# Patient Record
Sex: Female | Born: 1935 | Race: White | Hispanic: No | Smoking: Never smoker
Health system: Southern US, Community
[De-identification: ages and names within clinical notes are randomized; demographics above are authoritative.]

## PROBLEM LIST (undated history)

## (undated) DIAGNOSIS — K5792 Diverticulitis of intestine, part unspecified, without perforation or abscess without bleeding: Secondary | ICD-10-CM

## (undated) DIAGNOSIS — C801 Malignant (primary) neoplasm, unspecified: Secondary | ICD-10-CM

## (undated) DIAGNOSIS — M8580 Other specified disorders of bone density and structure, unspecified site: Secondary | ICD-10-CM

## (undated) DIAGNOSIS — Z86718 Personal history of other venous thrombosis and embolism: Secondary | ICD-10-CM

## (undated) DIAGNOSIS — I493 Ventricular premature depolarization: Secondary | ICD-10-CM

## (undated) DIAGNOSIS — N952 Postmenopausal atrophic vaginitis: Secondary | ICD-10-CM

## (undated) DIAGNOSIS — N3281 Overactive bladder: Secondary | ICD-10-CM

## (undated) HISTORY — DX: Postmenopausal atrophic vaginitis: N95.2

## (undated) HISTORY — PX: THYROIDECTOMY, PARTIAL: SHX18

## (undated) HISTORY — DX: Malignant (primary) neoplasm, unspecified: C80.1

## (undated) HISTORY — DX: Overactive bladder: N32.81

## (undated) HISTORY — DX: Ventricular premature depolarization: I49.3

## (undated) HISTORY — PX: OTHER SURGICAL HISTORY: SHX169

## (undated) HISTORY — DX: Personal history of other venous thrombosis and embolism: Z86.718

## (undated) HISTORY — PX: DILATION AND CURETTAGE OF UTERUS: SHX78

## (undated) HISTORY — DX: Other specified disorders of bone density and structure, unspecified site: M85.80

---

## 1998-06-08 HISTORY — PX: CHOLECYSTECTOMY: SHX55

## 1998-07-15 ENCOUNTER — Other Ambulatory Visit: Admission: RE | Admit: 1998-07-15 | Discharge: 1998-07-15 | Payer: Self-pay | Admitting: Obstetrics and Gynecology

## 1999-09-12 ENCOUNTER — Other Ambulatory Visit: Admission: RE | Admit: 1999-09-12 | Discharge: 1999-09-12 | Payer: Self-pay | Admitting: Obstetrics and Gynecology

## 1999-09-24 ENCOUNTER — Other Ambulatory Visit: Admission: RE | Admit: 1999-09-24 | Discharge: 1999-09-24 | Payer: Self-pay | Admitting: Obstetrics and Gynecology

## 1999-09-24 ENCOUNTER — Encounter (INDEPENDENT_AMBULATORY_CARE_PROVIDER_SITE_OTHER): Payer: Self-pay

## 2000-10-15 ENCOUNTER — Other Ambulatory Visit: Admission: RE | Admit: 2000-10-15 | Discharge: 2000-10-15 | Payer: Self-pay | Admitting: Obstetrics and Gynecology

## 2001-10-25 ENCOUNTER — Other Ambulatory Visit: Admission: RE | Admit: 2001-10-25 | Discharge: 2001-10-25 | Payer: Self-pay | Admitting: Obstetrics and Gynecology

## 2002-10-27 ENCOUNTER — Other Ambulatory Visit: Admission: RE | Admit: 2002-10-27 | Discharge: 2002-10-27 | Payer: Self-pay | Admitting: Obstetrics and Gynecology

## 2003-11-15 ENCOUNTER — Other Ambulatory Visit: Admission: RE | Admit: 2003-11-15 | Discharge: 2003-11-15 | Payer: Self-pay | Admitting: Obstetrics and Gynecology

## 2004-12-25 ENCOUNTER — Other Ambulatory Visit: Admission: RE | Admit: 2004-12-25 | Discharge: 2004-12-25 | Payer: Self-pay | Admitting: Obstetrics and Gynecology

## 2005-06-08 DIAGNOSIS — C801 Malignant (primary) neoplasm, unspecified: Secondary | ICD-10-CM

## 2005-06-08 HISTORY — DX: Malignant (primary) neoplasm, unspecified: C80.1

## 2005-06-08 HISTORY — PX: BREAST LUMPECTOMY: SHX2

## 2006-01-05 ENCOUNTER — Other Ambulatory Visit: Admission: RE | Admit: 2006-01-05 | Discharge: 2006-01-05 | Payer: Self-pay | Admitting: Obstetrics and Gynecology

## 2007-01-12 ENCOUNTER — Other Ambulatory Visit: Admission: RE | Admit: 2007-01-12 | Discharge: 2007-01-12 | Payer: Self-pay | Admitting: Obstetrics and Gynecology

## 2007-11-27 ENCOUNTER — Emergency Department (HOSPITAL_COMMUNITY): Admission: EM | Admit: 2007-11-27 | Discharge: 2007-11-27 | Payer: Self-pay | Admitting: Emergency Medicine

## 2008-02-07 ENCOUNTER — Ambulatory Visit: Payer: Self-pay | Admitting: Obstetrics and Gynecology

## 2008-02-07 ENCOUNTER — Other Ambulatory Visit: Admission: RE | Admit: 2008-02-07 | Discharge: 2008-02-07 | Payer: Self-pay | Admitting: Obstetrics and Gynecology

## 2008-02-07 ENCOUNTER — Encounter: Payer: Self-pay | Admitting: Obstetrics and Gynecology

## 2008-03-13 ENCOUNTER — Ambulatory Visit: Payer: Self-pay | Admitting: Obstetrics and Gynecology

## 2008-05-07 ENCOUNTER — Emergency Department: Payer: Self-pay | Admitting: Emergency Medicine

## 2008-07-23 ENCOUNTER — Ambulatory Visit: Payer: Self-pay | Admitting: Obstetrics and Gynecology

## 2008-07-24 ENCOUNTER — Ambulatory Visit: Payer: Self-pay | Admitting: Obstetrics and Gynecology

## 2008-07-26 ENCOUNTER — Ambulatory Visit: Payer: Self-pay | Admitting: Women's Health

## 2008-08-15 ENCOUNTER — Ambulatory Visit: Payer: Self-pay | Admitting: Women's Health

## 2009-03-22 ENCOUNTER — Other Ambulatory Visit: Admission: RE | Admit: 2009-03-22 | Discharge: 2009-03-22 | Payer: Self-pay | Admitting: Obstetrics and Gynecology

## 2009-03-22 ENCOUNTER — Ambulatory Visit: Payer: Self-pay | Admitting: Obstetrics and Gynecology

## 2009-03-22 ENCOUNTER — Encounter: Payer: Self-pay | Admitting: Obstetrics and Gynecology

## 2009-07-01 ENCOUNTER — Emergency Department: Payer: Self-pay | Admitting: Emergency Medicine

## 2010-04-24 ENCOUNTER — Other Ambulatory Visit: Admission: RE | Admit: 2010-04-24 | Discharge: 2010-04-24 | Payer: Self-pay | Admitting: Obstetrics and Gynecology

## 2010-04-24 ENCOUNTER — Ambulatory Visit: Payer: Self-pay | Admitting: Obstetrics and Gynecology

## 2011-06-09 DIAGNOSIS — Z86718 Personal history of other venous thrombosis and embolism: Secondary | ICD-10-CM

## 2011-06-09 HISTORY — DX: Personal history of other venous thrombosis and embolism: Z86.718

## 2011-08-24 ENCOUNTER — Ambulatory Visit (INDEPENDENT_AMBULATORY_CARE_PROVIDER_SITE_OTHER): Payer: Medicare Other | Admitting: Women's Health

## 2011-08-24 ENCOUNTER — Other Ambulatory Visit: Payer: Self-pay | Admitting: Women's Health

## 2011-08-24 DIAGNOSIS — N899 Noninflammatory disorder of vagina, unspecified: Secondary | ICD-10-CM

## 2011-08-24 DIAGNOSIS — N390 Urinary tract infection, site not specified: Secondary | ICD-10-CM

## 2011-08-24 DIAGNOSIS — N3281 Overactive bladder: Secondary | ICD-10-CM | POA: Insufficient documentation

## 2011-08-24 DIAGNOSIS — N952 Postmenopausal atrophic vaginitis: Secondary | ICD-10-CM | POA: Insufficient documentation

## 2011-08-24 DIAGNOSIS — C801 Malignant (primary) neoplasm, unspecified: Secondary | ICD-10-CM | POA: Insufficient documentation

## 2011-08-24 DIAGNOSIS — M8580 Other specified disorders of bone density and structure, unspecified site: Secondary | ICD-10-CM | POA: Insufficient documentation

## 2011-08-24 DIAGNOSIS — R300 Dysuria: Secondary | ICD-10-CM

## 2011-08-24 DIAGNOSIS — N898 Other specified noninflammatory disorders of vagina: Secondary | ICD-10-CM

## 2011-08-24 LAB — URINALYSIS W MICROSCOPIC + REFLEX CULTURE
Casts: NONE SEEN
Crystals: NONE SEEN
Nitrite: NEGATIVE
Specific Gravity, Urine: 1.01 (ref 1.005–1.030)
pH: 6 (ref 5.0–8.0)

## 2011-08-24 LAB — WET PREP FOR TRICH, YEAST, CLUE

## 2011-08-24 MED ORDER — CIPROFLOXACIN HCL 250 MG PO TABS
250.0000 mg | ORAL_TABLET | Freq: Two times a day (BID) | ORAL | Status: AC
Start: 2011-08-24 — End: 2011-09-03

## 2011-08-24 NOTE — Patient Instructions (Signed)

## 2011-08-24 NOTE — Progress Notes (Signed)
Patient ID: Rebecca Velez, female   DOB: 1934-01-24, 78 y.o.   MRN: [id number] Presents with complaint of urinary urgency, frequency, pain at the end of  stream for several days. Denies a fever, slight vaginal irritation, denies discharge. Not sexually active.  UA: Large leukocytes, TNTC wbc's, many bacteria, moderate blood. Exam: No CVAT, external genitalia within normal limits, speculum exam scant discharge wet prep negative.  UTI  Plan: Cipro 250 mg twice a day for 5 days #10 no refills. Urine culture pending. UTI prevention discussed. Instructed to call if no relief of symptoms. Urine culture pending.

## 2011-08-27 LAB — URINE CULTURE: Colony Count: >=100000

## 2012-03-30 ENCOUNTER — Encounter: Payer: Self-pay | Admitting: Obstetrics and Gynecology

## 2012-03-30 ENCOUNTER — Ambulatory Visit (INDEPENDENT_AMBULATORY_CARE_PROVIDER_SITE_OTHER): Payer: Medicare Other | Admitting: Obstetrics and Gynecology

## 2012-03-30 ENCOUNTER — Other Ambulatory Visit (HOSPITAL_COMMUNITY)
Admission: RE | Admit: 2012-03-30 | Discharge: 2012-03-30 | Disposition: A | Discharge status: Home / Self Care | Payer: Medicare Other | Source: Ambulatory Visit | Attending: Obstetrics and Gynecology | Admitting: Obstetrics and Gynecology

## 2012-03-30 VITALS — BP 120/74 | Ht 67.5 in | Wt 154.0 lb

## 2012-03-30 DIAGNOSIS — M949 Disorder of cartilage, unspecified: Secondary | ICD-10-CM

## 2012-03-30 DIAGNOSIS — N8500 Endometrial hyperplasia, unspecified: Secondary | ICD-10-CM

## 2012-03-30 DIAGNOSIS — N952 Postmenopausal atrophic vaginitis: Secondary | ICD-10-CM

## 2012-03-30 DIAGNOSIS — M899 Disorder of bone, unspecified: Secondary | ICD-10-CM

## 2012-03-30 DIAGNOSIS — Z124 Encounter for screening for malignant neoplasm of cervix: Secondary | ICD-10-CM

## 2012-03-30 DIAGNOSIS — R3915 Urgency of urination: Secondary | ICD-10-CM

## 2012-03-30 DIAGNOSIS — R351 Nocturia: Secondary | ICD-10-CM

## 2012-03-30 DIAGNOSIS — C50919 Malignant neoplasm of unspecified site of unspecified female breast: Secondary | ICD-10-CM

## 2012-03-30 DIAGNOSIS — M8580 Other specified disorders of bone density and structure, unspecified site: Secondary | ICD-10-CM

## 2012-03-30 NOTE — Patient Instructions (Signed)
Continue yearly mammograms. Continue periodic bone densities. 

## 2012-03-30 NOTE — Progress Notes (Addendum)
Patient came to see me today for followup. She remains cancer free from her breast cancer. This was diagnosed in 2007. It was a ductal cancer and was treated with lumpectomy, radiation therapy, and aromatase inhibitors. She just had a normal breast mammogram. She is done with her aromatase inhibitors. She is having no vaginal bleeding. She is having no pelvic pain. She has never had an abnormal Pap smear. Her last Pap smear was 2011. Her oncologist would like her to continue to have Pap smears. She has osteopenia which has remained stable. She has  bone densities at [facility] and had one this year which was stable. She has urinary urgency and frequency. She does not have incontinence. She does have vaginal dryness but does not need medication. She was previously treated by me for what sounds like endometrial hyperplasia with several D&Cs. She however describes it as squamous atypia. That part of her record is off-site.  ROS: 12 system review done. Pertinent positives above.  Physical examination:Rebecca Velez present.HEENT within normal limits. Neck: Thyroid not large. No masses. Supraclavicular nodes: not enlarged. Breasts: Examined in both sitting and lying  position. No skin changes and no masses. Abdomen: Soft no guarding rebound or masses or hernia. Pelvic: External: Within normal limits. BUS: Within normal limits. Vaginal:within normal limits. Poor estrogen effect. No evidence of cystocele rectocele or enterocele. Cervix: clean. Uterus: Normal size and shape. Adnexa: No masses. Rectovaginal exam: Confirmatory and negative. Extremities: Within normal limits.  Assessment: #1. Atrophic vaginitis #2. 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 Detrussor instability with urgency and frequency #4. Osteopenia. #5. Possible history of endometrial hyperplasia.  Plan: Continue yearly mammograms. Continue periodic bone densities. Pap done.The new Pap smear guidelines were discussed with the patient. We will get  patient's old records to resolve issue about endometrium. Discussed medication for detrussor instability. For the moment she declined.  Addendum: Patient had adenomatous hyperplasia of endometrium which responded to progestin. Her Pap smears were normal.

## 2012-04-06 DIAGNOSIS — N8500 Endometrial hyperplasia, unspecified: Secondary | ICD-10-CM | POA: Insufficient documentation

## 2013-04-04 ENCOUNTER — Encounter: Payer: Self-pay | Admitting: Women's Health

## 2013-04-12 ENCOUNTER — Encounter: Payer: Self-pay | Admitting: Women's Health

## 2014-04-09 ENCOUNTER — Encounter: Payer: Self-pay | Admitting: Obstetrics and Gynecology

## 2015-08-01 ENCOUNTER — Ambulatory Visit: Payer: Self-pay | Admitting: Gynecology

## 2015-08-05 ENCOUNTER — Encounter: Payer: Self-pay | Admitting: Gynecology

## 2015-08-05 ENCOUNTER — Ambulatory Visit (INDEPENDENT_AMBULATORY_CARE_PROVIDER_SITE_OTHER): Payer: Medicare Other | Admitting: Gynecology

## 2015-08-05 VITALS — BP 130/88 | Ht 67.25 in | Wt 163.0 lb

## 2015-08-05 DIAGNOSIS — Z853 Personal history of malignant neoplasm of breast: Secondary | ICD-10-CM

## 2015-08-05 DIAGNOSIS — Z01419 Encounter for gynecological examination (general) (routine) without abnormal findings: Secondary | ICD-10-CM

## 2015-08-05 DIAGNOSIS — M810 Age-related osteoporosis without current pathological fracture: Secondary | ICD-10-CM | POA: Diagnosis not present

## 2015-08-05 NOTE — Progress Notes (Signed)
Rebecca Velez 1934-03-21 [id number]   History:    82 y.o.  for annual gyn exam with no complaints today. Review of patient's record indicated she was last seen here in 2013. She was seen in my partner Dr. Cherylann Banas. Patient lives in [place] and has her primary care physician down there doing her blood work who also has been following thyroid nodules and ultrasound scans of her neck and she has informing the recently she was started on levothyroxine. Patient been diagnosed with right breast cancer in 2007:  Infiltrating ductal carcinoma estrogen and adjust her own positive, HER-2/neu negative which resulted in lumpectomy, radiation therapy and aromatase inhibitor and has completed treatment. She has been getting her mammograms at took every year next one is due in April 2017. She has informed me that last year she was diagnosed with osteoporosis but is has been hesitant to start on bisphosphonate. She denies any vaginal bleeding she is not sexually active. Many years ago she had a D&C and was followed for endometrial hyperplasia however according to my partners note she had described it as squamous atypia.   Patient states that her last colonoscopy was in 2011 and was report be normal although she does have family history of colorectal cancer.  Past medical history,surgical history, family history and social history were all reviewed and documented in the EPIC chart.  Gynecologic History No LMP recorded. Patient is postmenopausal. Contraception: post menopausal status Last Pap: 2013. Results were: normal Last mammogram: 2016. Results were: normal  Obstetric History OB History  Gravida Para Term Preterm AB SAB TAB Ectopic Multiple Living  _0 # Outcome Date GA Lbr Len/2nd Weight Sex Delivery Anes PTL Lv  3 AB           2 AB           1 Para                ROS: A ROS was performed and pertinent positives and negatives are included in the history.  GENERAL: No fevers or chills. HEENT: No change in vision, no earache, sore throat or sinus congestion. NECK: No pain or stiffness. CARDIOVASCULAR: No chest pain or pressure. No palpitations. PULMONARY: No shortness of breath, cough or wheeze. GASTROINTESTINAL: No abdominal pain, nausea, vomiting or diarrhea, melena or bright red blood per rectum. GENITOURINARY: No urinary frequency, urgency, hesitancy or dysuria. MUSCULOSKELETAL: No joint or muscle pain, no back pain, no recent trauma. DERMATOLOGIC: No rash, no itching, no lesions. ENDOCRINE: No polyuria, polydipsia, no heat or cold intolerance. No recent change in weight. HEMATOLOGICAL: No anemia or easy bruising or bleeding. NEUROLOGIC: No headache, seizures, numbness, tingling or weakness. PSYCHIATRIC: No depression, no loss of interest in normal activity or change in sleep pattern.     Exam: chaperone present  BP 130/88 mmHg  Ht 5' 7.25" (1.708 m)  Wt 163 lb (73.936 kg)  BMI 25.34 kg/m2  Body mass index is 25.34 kg/(m^2).  General appearance : Well developed well nourished female. No acute distress HEENT: Eyes: no retinal hemorrhage or exudates,  Neck supple, trachea midline, no carotid bruits, no thyroidmegaly Lungs: Clear to auscultation, no rhonchi or wheezes, or rib retractions  Heart: Regular rate and rhythm, no murmurs or gallops Breast:Examined in sitting and supine position were symmetrical in appearance, no palpable masses or tenderness,  no skin retraction, no nipple inversion, no nipple discharge, no skin  discoloration, no axillary or supraclavicular lymphadenopathy Abdomen: no palpable masses or tenderness, no rebound or guarding Extremities: no edema or skin discoloration or tenderness  Pelvic:  Bartholin, Urethra, Skene Glands: Within normal limits             Vagina: No gross lesions or dischargeAtrophic changes  Cervix: No gross lesions or discharge  Uterus  anteverted, normal size, shape and consistency, non-tender and  mobile  Adnexa  Without masses or tenderness  Anus and perineum  normal   Rectovaginal  normal sphincter tone without palpated masses or tenderness             Hemoccult PCP will provide     Assessment/Plan:  82 y.o. female for annual exam who has informed me that she was diagnosed last year by her PCP in [place] as having osteoporosis but she has been hesitant to start any anti-resorptive agent. She will make an appointment to see me next month when she is in town and bringing the bone density report so that we can review and discuss. We will check a calcium, vitamin D and PTH level today. Also because of her past history of breast cancer we'll do an ultrasound of the pelvis at the same time. Pap smear no longer indicated according to the guidelines.  Terrance Mass MD, 3:10 PM 08/05/2015

## 2015-08-05 NOTE — Patient Instructions (Signed)
Zoledronic Acid injection (Paget's Disease, Osteoporosis) What is this medicine? ZOLEDRONIC ACID (ZOE le dron ik AS id) lowers the amount of calcium loss from bone. It is used to treat Paget's disease and osteoporosis in women. This medicine may be used for other purposes; ask your health care provider or pharmacist if you have questions. What should I tell my health care provider before I take this medicine? They need to know if you have any of these conditions: -aspirin-sensitive asthma -cancer, especially if you are receiving medicines used to treat cancer -dental disease or wear dentures -infection -kidney disease -low levels of calcium in the blood -past surgery on the parathyroid gland or intestines -receiving corticosteroids like dexamethasone or prednisone -an unusual or allergic reaction to zoledronic acid, other medicines, foods, dyes, or preservatives -pregnant or trying to get pregnant -breast-feeding How should I use this medicine? This medicine is for infusion into a vein. It is given by a health care professional in a hospital or clinic setting. Talk to your pediatrician regarding the use of this medicine in children. This medicine is not approved for use in children. Overdosage: If you think you have taken too much of this medicine contact a poison control center or emergency room at once. NOTE: This medicine is only for you. Do not share this medicine with others. What if I miss a dose? It is important not to miss your dose. Call your doctor or health care professional if you are unable to keep an appointment. What may interact with this medicine? -certain antibiotics given by injection -NSAIDs, medicines for pain and inflammation, like ibuprofen or naproxen -some diuretics like bumetanide, furosemide -teriparatide This list may not describe all possible interactions. Give your health care provider a list of all the medicines, herbs, non-prescription drugs, or dietary  supplements you use. Also tell them if you smoke, drink alcohol, or use illegal drugs. Some items may interact with your medicine. What should I watch for while using this medicine? Visit your doctor or health care professional for regular checkups. It may be some time before you see the benefit from this medicine. Do not stop taking your medicine unless your doctor tells you to. Your doctor may order blood tests or other tests to see how you are doing. Women should inform their doctor if they wish to become pregnant or think they might be pregnant. There is a potential for serious side effects to an unborn child. Talk to your health care professional or pharmacist for more information. You should make sure that you get enough calcium and vitamin D while you are taking this medicine. Discuss the foods you eat and the vitamins you take with your health care professional. Some people who take this medicine have severe bone, joint, and/or muscle pain. This medicine may also increase your risk for jaw problems or a broken thigh bone. Tell your doctor right away if you have severe pain in your jaw, bones, joints, or muscles. Tell your doctor if you have any pain that does not go away or that gets worse. Tell your dentist and dental surgeon that you are taking this medicine. You should not have major dental surgery while on this medicine. See your dentist to have a dental exam and fix any dental problems before starting this medicine. Take good care of your teeth while on this medicine. Make sure you see your dentist for regular follow-up appointments. What side effects may I notice from receiving this medicine? Side effects that you should  report to your doctor or health care professional as soon as possible: -allergic reactions like skin rash, itching or hives, swelling of the face, lips, or tongue -anxiety, confusion, or depression -breathing problems -changes in vision -eye pain -feeling faint or  lightheaded, falls -jaw pain, especially after dental work -mouth sores -muscle cramps, stiffness, or weakness -redness, blistering, peeling or loosening of the skin, including inside the mouth -trouble passing urine or change in the amount of urine Side effects that usually do not require medical attention (report to your doctor or health care professional if they continue or are bothersome): -bone, joint, or muscle pain -constipation -diarrhea -fever -hair loss -irritation at site where injected -loss of appetite -nausea, vomiting -stomach upset -trouble sleeping -trouble swallowing -weak or tired This list may not describe all possible side effects. Call your doctor for medical advice about side effects. You may report side effects to FDA at 1-800-FDA-1088. Where should I keep my medicine? This drug is given in a hospital or clinic and will not be stored at home. NOTE: This sheet is a summary. It may not cover all possible information. If you have questions about this medicine, talk to your doctor, pharmacist, or health care provider.    2016, Elsevier/Gold Standard. (2013-10-21 14:19:57) Denosumab injection What is this medicine? DENOSUMAB (den oh sue mab) slows bone breakdown. Prolia is used to treat osteoporosis in women after menopause and in men. Delton See is used to prevent bone fractures and other bone problems caused by cancer bone metastases. Delton See is also used to treat giant cell tumor of the bone. This medicine may be used for other purposes; ask your health care provider or pharmacist if you have questions. What should I tell my health care provider before I take this medicine? They need to know if you have any of these conditions: -dental disease -eczema -infection or history of infections -kidney disease or on dialysis -low blood calcium or vitamin D -malabsorption syndrome -scheduled to have surgery or tooth extraction -taking medicine that contains  denosumab -thyroid or parathyroid disease -an unusual reaction to denosumab, other medicines, foods, dyes, or preservatives -pregnant or trying to get pregnant -breast-feeding How should I use this medicine? This medicine is for injection under the skin. It is given by a health care professional in a hospital or clinic setting. If you are getting Prolia, a special MedGuide will be given to you by the pharmacist with each prescription and refill. Be sure to read this information carefully each time. For Prolia, talk to your pediatrician regarding the use of this medicine in children. Special care may be needed. For Delton See, talk to your pediatrician regarding the use of this medicine in children. While this drug may be prescribed for children as young as 13 years for selected conditions, precautions do apply. Overdosage: If you think you have taken too much of this medicine contact a poison control center or emergency room at once. NOTE: This medicine is only for you. Do not share this medicine with others. What if I miss a dose? It is important not to miss your dose. Call your doctor or health care professional if you are unable to keep an appointment. What may interact with this medicine? Do not take this medicine with any of the following medications: -other medicines containing denosumab This medicine may also interact with the following medications: -medicines that suppress the immune system -medicines that treat cancer -steroid medicines like prednisone or cortisone This list may not describe all possible interactions.  Give your health care provider a list of all the medicines, herbs, non-prescription drugs, or dietary supplements you use. Also tell them if you smoke, drink alcohol, or use illegal drugs. Some items may interact with your medicine. What should I watch for while using this medicine? Visit your doctor or health care professional for regular checks on your progress. Your doctor  or health care professional may order blood tests and other tests to see how you are doing. Call your doctor or health care professional if you get a cold or other infection while receiving this medicine. Do not treat yourself. This medicine may decrease your body's ability to fight infection. You should make sure you get enough calcium and vitamin D while you are taking this medicine, unless your doctor tells you not to. Discuss the foods you eat and the vitamins you take with your health care professional. See your dentist regularly. Brush and floss your teeth as directed. Before you have any dental work done, tell your dentist you are receiving this medicine. Do not become pregnant while taking this medicine or for 5 months after stopping it. Women should inform their doctor if they wish to become pregnant or think they might be pregnant. There is a potential for serious side effects to an unborn child. Talk to your health care professional or pharmacist for more information. What side effects may I notice from receiving this medicine? Side effects that you should report to your doctor or health care professional as soon as possible: -allergic reactions like skin rash, itching or hives, swelling of the face, lips, or tongue -breathing problems -chest pain -fast, irregular heartbeat -feeling faint or lightheaded, falls -fever, chills, or any other sign of infection -muscle spasms, tightening, or twitches -numbness or tingling -skin blisters or bumps, or is dry, peels, or red -slow healing or unexplained pain in the mouth or jaw -unusual bleeding or bruising Side effects that usually do not require medical attention (Report these to your doctor or health care professional if they continue or are bothersome.): -muscle pain -stomach upset, gas This list may not describe all possible side effects. Call your doctor for medical advice about side effects. You may report side effects to FDA at  1-800-FDA-1088. Where should I keep my medicine? This medicine is only given in a clinic, doctor's office, or other health care setting and will not be stored at home. NOTE: This sheet is a summary. It may not cover all possible information. If you have questions about this medicine, talk to your doctor, pharmacist, or health care provider.    2016, Elsevier/Gold Standard. (2011-11-23 12:37:47) Osteoporosis Osteoporosis is the thinning and loss of density in the bones. Osteoporosis makes the bones more brittle, fragile, and likely to break (fracture). Over time, osteoporosis can cause the bones to become so weak that they fracture after a simple fall. The bones most likely to fracture are the bones in the hip, wrist, and spine. CAUSES  The exact cause is not known. RISK FACTORS Anyone can develop osteoporosis. You may be at greater risk if you have a family history of the condition or have poor nutrition. You may also have a higher risk if you are:   Female.   34 years old or older.  A smoker.  Not physically active.   White or Asian.  Slender. SIGNS AND SYMPTOMS  A fracture might be the first sign of the disease, especially if it results from a fall or injury that would not usually  cause a bone to break. Other signs and symptoms include:   Low back and neck pain.  Stooped posture.  Height loss. DIAGNOSIS  To make a diagnosis, your health care provider may:  Take a medical history.  Perform a physical exam.  Order tests, such as:  A bone mineral density test.  A dual-energy X-ray absorptiometry test. TREATMENT  The goal of osteoporosis treatment is to strengthen your bones to reduce your risk of a fracture. Treatment may involve:  Making lifestyle changes, such as:  Eating a diet rich in calcium.  Doing weight-bearing and muscle-strengthening exercises.  Stopping tobacco use.  Limiting alcohol intake.  Taking medicine to slow the process of bone loss or to  increase bone density.  Monitoring your levels of calcium and vitamin D. HOME CARE INSTRUCTIONS  Include calcium and vitamin D in your diet. Calcium is important for bone health, and vitamin D helps the body absorb calcium.  Perform weight-bearing and muscle-strengthening exercises as directed by your health care provider.  Do not use any tobacco products, including cigarettes, chewing tobacco, and electronic cigarettes. If you need help quitting, ask your health care provider.  Limit your alcohol intake.  Take medicines only as directed by your health care provider.  Keep all follow-up visits as directed by your health care provider. This is important.  Take precautions at home to lower your risk of falling, such as:  Keeping rooms well lit and clutter free.  Installing safety rails on stairs.  Using rubber mats in the bathroom and other areas that are often wet or slippery. SEEK IMMEDIATE MEDICAL CARE IF:  You fall or injure yourself.    This information is not intended to replace advice given to you by your health care provider. Make sure you discuss any questions you have with your health care provider.   Document Released: 03/04/2005 Document Revised: 06/15/2014 Document Reviewed: 11/02/2013 Elsevier Interactive Patient Education [employer].

## 2015-09-04 ENCOUNTER — Ambulatory Visit: Payer: Medicare Other | Admitting: Gynecology

## 2015-09-04 ENCOUNTER — Other Ambulatory Visit: Payer: Medicare Other

## 2015-10-16 ENCOUNTER — Encounter: Payer: Self-pay | Admitting: Gynecology

## 2015-10-16 ENCOUNTER — Ambulatory Visit (INDEPENDENT_AMBULATORY_CARE_PROVIDER_SITE_OTHER): Payer: Medicare Other

## 2015-10-16 ENCOUNTER — Other Ambulatory Visit: Payer: Self-pay | Admitting: Gynecology

## 2015-10-16 ENCOUNTER — Ambulatory Visit (INDEPENDENT_AMBULATORY_CARE_PROVIDER_SITE_OTHER): Payer: Medicare Other | Admitting: Gynecology

## 2015-10-16 DIAGNOSIS — D251 Intramural leiomyoma of uterus: Secondary | ICD-10-CM

## 2015-10-16 DIAGNOSIS — Z853 Personal history of malignant neoplasm of breast: Secondary | ICD-10-CM | POA: Diagnosis not present

## 2015-10-16 DIAGNOSIS — M810 Age-related osteoporosis without current pathological fracture: Secondary | ICD-10-CM

## 2015-10-16 DIAGNOSIS — N83201 Unspecified ovarian cyst, right side: Secondary | ICD-10-CM

## 2015-10-16 NOTE — Progress Notes (Signed)
   Patient is a 82 year old who was seeductal carcinoma estrogen and adjust her own positive, HER-2/neu negative which resulted in lumpectomy, radiation therapy and aromatase inhibitor and has completed treatment.n in the office in February this year for her annual gynecological examination she lives in [place]. See previous note for additional details from that office visit. Patient was diagnosed with breast cancer in 2007 it was an infiltrating. She was diagnosed by her primary care physician based on a bone density study done in [facility] that she was osteoporotic she was very hasn't had on starting any form of medication and was here to discuss a different treatment options as well. Because of her history of breast cancer and ultrasound was done here today in the office which demonstrated the following:  Uterus measured 5.2 x 4.1 x 2.5 cm with endometrial stripe of 1.8 mm. Small intramural fibroid measuring 6 x 8 mm was noted. Right ovary was atrophic with an echo-free fluid-filled tubular cyst measuring 16 x 8 x 11 mm no color-flow. It. On itself like a small hydrosalpinx. Left ovary was atrophic no apparent adnexal masses on right or left ovary. No fluid in the cul-de-sac.  Patient was asymptomatic.  We had a lengthy discussion talking about the risk benefits and pros and cons of the following antiresorptive agents: Forteo Prolia Actonel Atelevia Boniva Fosamax Reclast I do not have her official bone density report from last year. She states that she will get her next bone density study in the same facility in [facility] next year and bringing the report so that we can sit down and discuss and then if her bone mineralization is not improving we will deathly need to start on medication as I have recommended.  Because of her small cyst on her ovary which appears to be a hydrosalpinx with going to get a CA 125 today. Her lab results done by  her PCP recently were brought with the patient which were all in the normal range consisting of CBC, comprehensive metabolic panel, fasting lipid profile, TSH and vitamin D level. A copy will be In our records  Greater than 50% of the time was spent counseling correlating care for this patient with past history of breast cancer and a small hydrosalpinx as well as to discuss her osteoporosis and recommendations for treatment. Time of consultation 15 minutes.   t

## 2015-10-16 NOTE — Patient Instructions (Signed)
Osteoporosis Osteoporosis is the thinning and loss of density in the bones. Osteoporosis makes the bones more brittle, fragile, and likely to break (fracture). Over time, osteoporosis can cause the bones to become so weak that they fracture after a simple fall. The bones most likely to fracture are the bones in the hip, wrist, and spine. CAUSES  The exact cause is not known. RISK FACTORS Anyone can develop osteoporosis. You may be at greater risk if you have a family history of the condition or have poor nutrition. You may also have a higher risk if you are:   Female.   80 years old or older.  A smoker.  Not physically active.   White or Asian.  Slender. SIGNS AND SYMPTOMS  A fracture might be the first sign of the disease, especially if it results from a fall or injury that would not usually cause a bone to break. Other signs and symptoms include:   Low back and neck pain.  Stooped posture.  Height loss. DIAGNOSIS  To make a diagnosis, your health care provider may:  Take a medical history.  Perform a physical exam.  Order tests, such as:  A bone mineral density test.  A dual-energy X-ray absorptiometry test. TREATMENT  The goal of osteoporosis treatment is to strengthen your bones to reduce your risk of a fracture. Treatment may involve:  Making lifestyle changes, such as:  Eating a diet rich in calcium.  Doing weight-bearing and muscle-strengthening exercises.  Stopping tobacco use.  Limiting alcohol intake.  Taking medicine to slow the process of bone loss or to increase bone density.  Monitoring your levels of calcium and vitamin D. HOME CARE INSTRUCTIONS  Include calcium and vitamin D in your diet. Calcium is important for bone health, and vitamin D helps the body absorb calcium.  Perform weight-bearing and muscle-strengthening exercises as directed by your health care provider.  Do not use any tobacco products, including cigarettes, chewing  tobacco, and electronic cigarettes. If you need help quitting, ask your health care provider.  Limit your alcohol intake.  Take medicines only as directed by your health care provider.  Keep all follow-up visits as directed by your health care provider. This is important.  Take precautions at home to lower your risk of falling, such as:  Keeping rooms well lit and clutter free.  Installing safety rails on stairs.  Using rubber mats in the bathroom and other areas that are often wet or slippery. SEEK IMMEDIATE MEDICAL CARE IF:  You fall or injure yourself.    This information is not intended to replace advice given to you by your health care provider. Make sure you discuss any questions you have with your health care provider.   Document Released: 03/04/2005 Document Revised: 06/15/2014 Document Reviewed: 11/02/2013 Elsevier Interactive Patient Education 2016 [employer] CA-125 Tumor Marker Test WHY AM I HAVING THIS TEST? This test is used to check the level of cancer antigen 125 (CA-125) in your blood. The CA-125 tumor marker test can be helpful in detecting ovarian cancer. The test is only performed if you are considered at high risk for ovarian cancer. Your health care provider may recommend this test if:  You have a strong family history of ovarian cancer.  You have a breast cancer antigen (BRCA) genetic defect. If you have already been diagnosed with ovarian cancer, your health care provider may use this test to help identify the extent of the disease and to monitor your response to treatment. WHAT KIND  OF SAMPLE IS TAKEN? A blood sample is required for this test. It is usually collected by inserting a needle into a vein. HOW DO I PREPARE FOR THE TEST? There is no preparation required for this test. WHAT ARE THE REFERENCE RANGES? Reference ranges are considered healthy ranges established after testing a large group of healthy people. Reference ranges may vary among different  people, labs, and hospitals. It is your responsibility to obtain your test results. Ask the lab or department performing the test when and how you will get your results. The reference range for this test is 0-35 units/mL or less than 35 kunits/L (SI units). WHAT DO THE RESULTS MEAN? Increased levels of CA-125 may indicate:  Certain types of cancer, including:  Ovarian cancer.  Pancreatic cancer.  Colon cancer.  Lung cancer.  Breast cancer.  Lymphoma.  Noncancerous (benign) disorders, including:  Cirrhosis.  Pregnancy.  Endometriosis.  Pancreatitis.  Pelvic inflammatory disease (PID). Talk with your health care provider to discuss your results, treatment options, and if necessary, the need for more tests. Talk with your health care provider if you have any questions about your results.   This information is not intended to replace advice given to you by your health care provider. Make sure you discuss any questions you have with your health care provider.   Document Released: 06/16/2004 Document Revised: 06/15/2014 Document Reviewed: 10/12/2013 Elsevier Interactive Patient Education 2016 Elsevier Inc[employer] Ovarian Cyst An ovarian cyst is a fluid-filled sac that forms on an ovary. The ovaries are small organs that produce eggs in women. Various types of cysts can form on the ovaries. Most are not cancerous. Many do not cause problems, and they often go away on their own. Some may cause symptoms and require treatment. Common types of ovarian cysts include:  Functional cysts--These cysts may occur every month during the menstrual cycle. This is normal. The cysts usually go away with the next menstrual cycle if the woman does not get pregnant. Usually, there are no symptoms with a functional cyst.  Endometrioma cysts--These cysts form from the tissue that lines the uterus. They are also called "chocolate cysts" because they become filled with blood that turns brown. This type of cyst  can cause pain in the lower abdomen during intercourse and with your menstrual period.  Cystadenoma cysts--This type develops from the cells on the outside of the ovary. These cysts can get very big and cause lower abdomen pain and pain with intercourse. This type of cyst can twist on itself, cut off its blood supply, and cause severe pain. It can also easily rupture and cause a lot of pain.  Dermoid cysts--This type of cyst is sometimes found in both ovaries. These cysts may contain different kinds of body tissue, such as skin, teeth, hair, or cartilage. They usually do not cause symptoms unless they get very big.  Theca lutein cysts--These cysts occur when too much of a certain hormone (human chorionic gonadotropin) is produced and overstimulates the ovaries to produce an egg. This is most common after procedures used to assist with the conception of a baby (in vitro fertilization). CAUSES   Fertility drugs can cause a condition in which multiple large cysts are formed on the ovaries. This is called ovarian hyperstimulation syndrome.  A condition called polycystic ovary syndrome can cause hormonal imbalances that can lead to nonfunctional ovarian cysts. SIGNS AND SYMPTOMS  Many ovarian cysts do not cause symptoms. If symptoms are present, they may include:  Pelvic pain or  pressure.  Pain in the lower abdomen.  Pain during sexual intercourse.  Increasing girth (swelling) of the abdomen.  Abnormal menstrual periods.  Increasing pain with menstrual periods.  Stopping having menstrual periods without being pregnant. DIAGNOSIS  These cysts are commonly found during a routine or annual pelvic exam. Tests may be ordered to find out more about the cyst. These tests may include:  Ultrasound.  X-ray of the pelvis.  CT scan.  MRI.  Blood tests. TREATMENT  Many ovarian cysts go away on their own without treatment. Your health care provider may want to check your cyst regularly for 2-3  months to see if it changes. For women in menopause, it is particularly important to monitor a cyst closely because of the higher rate of ovarian cancer in menopausal women. When treatment is needed, it may include any of the following:  A procedure to drain the cyst (aspiration). This may be done using a long needle and ultrasound. It can also be done through a laparoscopic procedure. This involves using a thin, lighted tube with a tiny camera on the end (laparoscope) inserted through a small incision.  Surgery to remove the whole cyst. This may be done using laparoscopic surgery or an open surgery involving a larger incision in the lower abdomen.  Hormone treatment or birth control pills. These methods are sometimes used to help dissolve a cyst. HOME CARE INSTRUCTIONS   Only take over-the-counter or prescription medicines as directed by your health care provider.  Follow up with your health care provider as directed.  Get regular pelvic exams and Pap tests. SEEK MEDICAL CARE IF:   Your periods are late, irregular, or painful, or they stop.  Your pelvic pain or abdominal pain does not go away.  Your abdomen becomes larger or swollen.  You have pressure on your bladder or trouble emptying your bladder completely.  You have pain during sexual intercourse.  You have feelings of fullness, pressure, or discomfort in your stomach.  You lose weight for no apparent reason.  You feel generally ill.  You become constipated.  You lose your appetite.  You develop acne.  You have an increase in body and facial hair.  You are gaining weight, without changing your exercise and eating habits.  You think you are pregnant. SEEK IMMEDIATE MEDICAL CARE IF:   You have increasing abdominal pain.  You feel sick to your stomach (nauseous), and you throw up (vomit).  You develop a fever that comes on suddenly.  You have abdominal pain during a bowel movement.  Your menstrual periods  become heavier than usual. MAKE SURE YOU:  Understand these instructions.  Will watch your condition.  Will get help right away if you are not doing well or get worse.   This information is not intended to replace advice given to you by your health care provider. Make sure you discuss any questions you have with your health care provider.   Document Released: 05/25/2005 Document Revised: 05/30/2013 Document Reviewed: 01/30/2013 Elsevier Interactive Patient Education [employer].

## 2015-10-17 LAB — CA 125: CA 125: 6 U/mL (ref <35)

## 2016-08-05 ENCOUNTER — Encounter: Payer: Self-pay | Admitting: Gynecology

## 2016-08-05 ENCOUNTER — Ambulatory Visit (INDEPENDENT_AMBULATORY_CARE_PROVIDER_SITE_OTHER): Payer: Medicare Other | Admitting: Gynecology

## 2016-08-05 VITALS — BP 134/80 | Ht 67.0 in | Wt 161.0 lb

## 2016-08-05 DIAGNOSIS — Z01411 Encounter for gynecological examination (general) (routine) with abnormal findings: Secondary | ICD-10-CM | POA: Diagnosis not present

## 2016-08-05 DIAGNOSIS — Z86711 Personal history of pulmonary embolism: Secondary | ICD-10-CM | POA: Insufficient documentation

## 2016-08-05 DIAGNOSIS — M810 Age-related osteoporosis without current pathological fracture: Secondary | ICD-10-CM | POA: Diagnosis not present

## 2016-08-05 DIAGNOSIS — Z853 Personal history of malignant neoplasm of breast: Secondary | ICD-10-CM

## 2016-08-05 NOTE — Progress Notes (Signed)
Rebecca Velez 1933-10-15 BP:[id number]   History:    83 y.o.  for annual gyn exam who is asymptomatic. Patient lives half the year here half the urine [facility]in [place]. See previous note for additional details from that office visit. Patient was diagnosed with breast cancer in 2007 it was an infiltrating. She was diagnosed by her primary care physician based on a bone density study done in [facility] that she was osteoporotic she was very hesitant on starting any medication vision is going to wait until her bone density study in the next few weeks to decide. She is leaning more towards Prolia. Patient had an ultrasound back in 2017 appear she had a small hydrosalpinx she also had a normal CA 125 for a small benign appearing cyst. Her PCP is been doing her blood work and her vaccines are up-to-date. She had a colonoscopy in 2011. She's in the process of seeing her gastroenterologist in [place] next few weeks. Patient's father grandfather and aunt on both sides of had colon cancer. She is getting her mammograms at [facility]. Patient with no prior history of any abnormal Pap smears.  Past medical history,surgical history, family history and social history were all reviewed and documented in the EPIC chart.  Gynecologic History No LMP recorded. Patient is postmenopausal. Contraception: post menopausal status Last Pap: 2013. Results were: normal Last mammogram: 2017 [facility] Results were: normal  Obstetric History OB History  Gravida Para Term Preterm AB Living  3 1     2 1   SAB TAB Ectopic Multiple Live Births               # Outcome Date GA Lbr Len/2nd Weight Sex Delivery Anes PTL Lv  3 AB           2 AB           1 Para                ROS: A ROS was performed and pertinent positives and negatives are included in the history.  GENERAL: No fevers or chills. HEENT: No  change in vision, no earache, sore throat or sinus congestion. NECK: No pain or stiffness. CARDIOVASCULAR: No chest pain or pressure. No palpitations. PULMONARY: No shortness of breath, cough or wheeze. GASTROINTESTINAL: No abdominal pain, nausea, vomiting or diarrhea, melena or bright red blood per rectum. GENITOURINARY: No urinary frequency, urgency, hesitancy or dysuria. MUSCULOSKELETAL: No joint or muscle pain, no back pain, no recent trauma. DERMATOLOGIC: No rash, no itching, no lesions. ENDOCRINE: No polyuria, polydipsia, no heat or cold intolerance. No recent change in weight. HEMATOLOGICAL: No anemia or easy bruising or bleeding. NEUROLOGIC: No headache, seizures, numbness, tingling or weakness. PSYCHIATRIC: No depression, no loss of interest in normal activity or change in sleep pattern.     Exam: chaperone present  BP 134/80   Ht 5\' 7"  (1.702 m)   Wt 161 lb (73 kg)   BMI 25.22 kg/m   Body mass index is 25.22 kg/m.  General appearance : Well developed well nourished female. No acute distress HEENT: Eyes: no retinal hemorrhage or exudates,  Neck supple, trachea midline, no carotid bruits, no thyroidmegaly Lungs: Clear to auscultation, no rhonchi or wheezes, or rib retractions  Heart: Regular rate and rhythm, no murmurs or gallops Breast:Examined in sitting and supine position were symmetrical in appearance, no palpable masses or tenderness,  no  skin retraction, no nipple inversion, no nipple discharge, no skin discoloration, no axillary or supraclavicular lymphadenopathy Abdomen: no palpable masses or tenderness, no rebound or guarding Extremities: no edema or skin discoloration or tenderness  Pelvic:  Bartholin, Urethra, Skene Glands: Within normal limits             Vagina: No gross lesions or discharge, atrophic changes  Cervix: No gross lesions or discharge  Uterus  anteverted, normal size, shape and consistency, non-tender and mobile  Adnexa  Without masses or  tenderness  Anus and perineum  normal   Rectovaginal  normal sphincter tone without palpated masses or tenderness             Hemoccult has follow up with gastroenterologist next few weeks     Assessment/Plan:  83 y.o. female for annual exam with history of osteoporosis we discussed once again the importance of reconsidering starting medication. She's going to have her bone density study in [facility] next few weeks and was a and discuss with her PCP to start treatment. She is interested in the Prolia. Also she will be speaking with her gastroenterologist in the next few weeks as well because of her strong family history of colorectal cancer and is due for her next colonoscopy. Pap smear no longer indicated according to new guidelines. And her PCP has been doing her blood work and her vaccines are up-to-date.   Terrance Mass MD, 2:50 PM 08/05/2016

## 2016-10-21 ENCOUNTER — Encounter: Payer: Self-pay | Admitting: Gynecology

## 2017-08-06 ENCOUNTER — Ambulatory Visit (INDEPENDENT_AMBULATORY_CARE_PROVIDER_SITE_OTHER): Payer: Medicare Other | Admitting: Obstetrics & Gynecology

## 2017-08-06 ENCOUNTER — Encounter: Payer: Self-pay | Admitting: Obstetrics & Gynecology

## 2017-08-06 VITALS — BP 134/80 | Ht 67.5 in | Wt 162.0 lb

## 2017-08-06 DIAGNOSIS — Z01411 Encounter for gynecological examination (general) (routine) with abnormal findings: Secondary | ICD-10-CM | POA: Diagnosis not present

## 2017-08-06 DIAGNOSIS — Z780 Asymptomatic menopausal state: Secondary | ICD-10-CM

## 2017-08-06 DIAGNOSIS — N393 Stress incontinence (female) (male): Secondary | ICD-10-CM

## 2017-08-06 DIAGNOSIS — M810 Age-related osteoporosis without current pathological fracture: Secondary | ICD-10-CM | POA: Diagnosis not present

## 2017-08-06 DIAGNOSIS — N952 Postmenopausal atrophic vaginitis: Secondary | ICD-10-CM

## 2017-08-06 DIAGNOSIS — Z853 Personal history of malignant neoplasm of breast: Secondary | ICD-10-CM

## 2017-08-06 NOTE — Patient Instructions (Addendum)
1. Encounter for gynecological examination with abnormal finding Gynecologic exam within normal for menopause.  Pap reflex done.  Breast exam status post right breast cancer within normal today.  Will continue annual screening mammogram at [facility].  Last screening mammogram May 2018 was normal.  Health labs with family physician.  Had colonoscopy with removal of a benign polyp in 2018.  2. Menopause present Well on no HRT.  No postmenopausal bleeding.  Abstinent.  3. Post-menopausal atrophic vaginitis Not symptomatic.  4. History of breast cancer History of right breast cancer.  Continues to do annual screening mammogram which have been normal.  5. Age-related osteoporosis without current pathological fracture On Prolia x 12/2016.  Vitamin D supplements, calcium rich nutrition and regular weightbearing physical activity recommended.  6. SUI (stress urinary incontinence, female) Referred to Urology  Rebecca Velez, it was a pleasure meeting you today!  I will inform you of your results as soon as they are available.   Health Maintenance for Postmenopausal Women Menopause is a normal process in which your reproductive ability comes to an end. This process happens gradually over a span of months to years, usually between the ages of 5 and 40. Menopause is complete when you have missed 12 consecutive menstrual periods. It is important to talk with your health care provider about some of the most common conditions that affect postmenopausal women, such as heart disease, cancer, and bone loss (osteoporosis). Adopting a healthy lifestyle and getting preventive care can help to promote your health and wellness. Those actions can also lower your chances of developing some of these common conditions. What should I know about menopause? During menopause, you may experience a number of symptoms, such as:  Moderate-to-severe hot flashes.  Night sweats.  Decrease in sex drive.  Mood  swings.  Headaches.  Tiredness.  Irritability.  Memory problems.  Insomnia.  Choosing to treat or not to treat menopausal changes is an individual decision that you make with your health care provider. What should I know about hormone replacement therapy and supplements? Hormone therapy products are effective for treating symptoms that are associated with menopause, such as hot flashes and night sweats. Hormone replacement carries certain risks, especially as you become older. If you are thinking about using estrogen or estrogen with progestin treatments, discuss the benefits and risks with your health care provider. What should I know about heart disease and stroke? Heart disease, heart attack, and stroke become more likely as you age. This may be due, in part, to the hormonal changes that your body experiences during menopause. These can affect how your body processes dietary fats, triglycerides, and cholesterol. Heart attack and stroke are both medical emergencies. There are many things that you can do to help prevent heart disease and stroke:  Have your blood pressure checked at least every 1-2 years. High blood pressure causes heart disease and increases the risk of stroke.  If you are 8-44 years old, ask your health care provider if you should take aspirin to prevent a heart attack or a stroke.  Do not use any tobacco products, including cigarettes, chewing tobacco, or electronic cigarettes. If you need help quitting, ask your health care provider.  It is important to eat a healthy diet and maintain a healthy weight. ? Be sure to include plenty of vegetables, fruits, low-fat dairy products, and lean protein. ? Avoid eating foods that are high in solid fats, added sugars, or salt (sodium).  Get regular exercise. This is one of the  most important things that you can do for your health. ? Try to exercise for at least 150 minutes each week. The type of exercise that you do should  increase your heart rate and make you sweat. This is known as moderate-intensity exercise. ? Try to do strengthening exercises at least twice each week. Do these in addition to the moderate-intensity exercise.  Know your numbers.Ask your health care provider to check your cholesterol and your blood glucose. Continue to have your blood tested as directed by your health care provider.  What should I know about cancer screening? There are several types of cancer. Take the following steps to reduce your risk and to catch any cancer development as early as possible. Breast Cancer  Practice breast self-awareness. ? This means understanding how your breasts normally appear and feel. ? It also means doing regular breast self-exams. Let your health care provider know about any changes, no matter how small.  If you are 89 or older, have a clinician do a breast exam (clinical breast exam or CBE) every year. Depending on your age, family history, and medical history, it may be recommended that you also have a yearly breast X-ray (mammogram).  If you have a family history of breast cancer, talk with your health care provider about genetic screening.  If you are at high risk for breast cancer, talk with your health care provider about having an MRI and a mammogram every year.  Breast cancer (BRCA) gene test is recommended for women who have family members with BRCA-related cancers. Results of the assessment will determine the need for genetic counseling and BRCA1 and for BRCA2 testing. BRCA-related cancers include these types: ? Breast. This occurs in males or females. ? Ovarian. ? Tubal. This may also be called fallopian tube cancer. ? Cancer of the abdominal or pelvic lining (peritoneal cancer). ? Prostate. ? Pancreatic.  Cervical, Uterine, and Ovarian Cancer Your health care provider may recommend that you be screened regularly for cancer of the pelvic organs. These include your ovaries, uterus,  and vagina. This screening involves a pelvic exam, which includes checking for microscopic changes to the surface of your cervix (Pap test).  For women ages 21-65, health care providers may recommend a pelvic exam and a Pap test every three years. For women ages 44-65, they may recommend the Pap test and pelvic exam, combined with testing for human papilloma virus (HPV), every five years. Some types of HPV increase your risk of cervical cancer. Testing for HPV may also be done on women of any age who have unclear Pap test results.  Other health care providers may not recommend any screening for nonpregnant women who are considered low risk for pelvic cancer and have no symptoms. Ask your health care provider if a screening pelvic exam is right for you.  If you have had past treatment for cervical cancer or a condition that could lead to cancer, you need Pap tests and screening for cancer for at least 20 years after your treatment. If Pap tests have been discontinued for you, your risk factors (such as having a new sexual partner) need to be reassessed to determine if you should start having screenings again. Some women have medical problems that increase the chance of getting cervical cancer. In these cases, your health care provider may recommend that you have screening and Pap tests more often.  If you have a family history of uterine cancer or ovarian cancer, talk with your health care provider about  genetic screening.  If you have vaginal bleeding after reaching menopause, tell your health care provider.  There are currently no reliable tests available to screen for ovarian cancer.  Lung Cancer Lung cancer screening is recommended for adults 69-67 years old who are at high risk for lung cancer because of a history of smoking. A yearly low-dose CT scan of the lungs is recommended if you:  Currently smoke.  Have a history of at least 30 pack-years of smoking and you currently smoke or have quit  within the past 15 years. A pack-year is smoking an average of one pack of cigarettes per day for one year.  Yearly screening should:  Continue until it has been 15 years since you quit.  Stop if you develop a health problem that would prevent you from having lung cancer treatment.  Colorectal Cancer  This type of cancer can be detected and can often be prevented.  Routine colorectal cancer screening usually begins at age 76 and continues through age 14.  If you have risk factors for colon cancer, your health care provider may recommend that you be screened at an earlier age.  If you have a family history of colorectal cancer, talk with your health care provider about genetic screening.  Your health care provider may also recommend using home test kits to check for hidden blood in your stool.  A small camera at the end of a tube can be used to examine your colon directly (sigmoidoscopy or colonoscopy). This is done to check for the earliest forms of colorectal cancer.  Direct examination of the colon should be repeated every 5-10 years until age 77. However, if early forms of precancerous polyps or small growths are found or if you have a family history or genetic risk for colorectal cancer, you may need to be screened more often.  Skin Cancer  Check your skin from head to toe regularly.  Monitor any moles. Be sure to tell your health care provider: ? About any new moles or changes in moles, especially if there is a change in a mole's shape or color. ? If you have a mole that is larger than the size of a pencil eraser.  If any of your family members has a history of skin cancer, especially at a young age, talk with your health care provider about genetic screening.  Always use sunscreen. Apply sunscreen liberally and repeatedly throughout the day.  Whenever you are outside, protect yourself by wearing long sleeves, pants, a wide-brimmed hat, and sunglasses.  What should I know  about osteoporosis? Osteoporosis is a condition in which bone destruction happens more quickly than new bone creation. After menopause, you may be at an increased risk for osteoporosis. To help prevent osteoporosis or the bone fractures that can happen because of osteoporosis, the following is recommended:  If you are 61-79 years old, get at least 1,000 mg of calcium and at least 600 mg of vitamin D per day.  If you are older than age 59 but younger than age 32, get at least 1,200 mg of calcium and at least 600 mg of vitamin D per day.  If you are older than age 60, get at least 1,200 mg of calcium and at least 800 mg of vitamin D per day.  Smoking and excessive alcohol intake increase the risk of osteoporosis. Eat foods that are rich in calcium and vitamin D, and do weight-bearing exercises several times each week as directed by your health care  provider. What should I know about how menopause affects my mental health? Depression may occur at any age, but it is more common as you become older. Common symptoms of depression include:  Low or sad mood.  Changes in sleep patterns.  Changes in appetite or eating patterns.  Feeling an overall lack of motivation or enjoyment of activities that you previously enjoyed.  Frequent crying spells.  Talk with your health care provider if you think that you are experiencing depression. What should I know about immunizations? It is important that you get and maintain your immunizations. These include:  Tetanus, diphtheria, and pertussis (Tdap) booster vaccine.  Influenza every year before the flu season begins.  Pneumonia vaccine.  Shingles vaccine.  Your health care provider may also recommend other immunizations. This information is not intended to replace advice given to you by your health care provider. Make sure you discuss any questions you have with your health care provider. Document Released: 07/17/2005 Document Revised: 12/13/2015  Document Reviewed: 02/26/2015 Elsevier Interactive Patient Education  2018 [employer].

## 2017-08-06 NOTE — Progress Notes (Signed)
Rebecca Velez Jun 04, 1934 [id number]   History:    84 y.o. Elvin So  Son lives here.  Patient spends her time between [facility] and [place].  RP:  Established patient presenting for annual gyn exam   HPI: Well into menopause on no hormone replacement therapy currently.  No postmenopausal bleeding.  Per patient was on hormone replacement therapy for 30 years.  History of right breast cancer.  Last mammogram normal at [facility] in May 2018.  No pelvic pain.  But patient with history of diverticulitis and complaints of frequent bloating sensation.  Does have a bowel movement about twice a day.  No blood in her stools currently.  Complains of stress urinary incontinence for which she has an appointment planned with the urologist.  Body mass index 25.  Health labs with family physician.  Past medical history,surgical history, family history and social history were all reviewed and documented in the EPIC chart.  Gynecologic History No LMP recorded. Patient is postmenopausal. Contraception: post menopausal status Last Pap: 2013. Results were: normal Last mammogram: 10/2016. Results were: normal Bone Density: Spring 2018 in [place]: Osteoporosis.  Started on Prolia 12/2016, 2nd dose 06/2017 Colonoscopy: 2018 Benign Polyp  Obstetric History OB History  Gravida Para Term Preterm AB Living  3 1     2 1   SAB TAB Ectopic Multiple Live Births               # Outcome Date GA Lbr Len/2nd Weight Sex Delivery Anes PTL Lv  3 AB           2 AB           1 Para                ROS: A ROS was performed and pertinent positives and negatives are included in the history.  GENERAL: No fevers or chills. HEENT: No change in vision, no earache, sore throat or sinus congestion. NECK: No pain or stiffness. CARDIOVASCULAR: No chest pain or pressure. No palpitations. PULMONARY: No shortness of breath, cough or wheeze. GASTROINTESTINAL: No abdominal pain, nausea, vomiting or diarrhea, melena or bright red blood per  rectum. GENITOURINARY: No urinary frequency, urgency, hesitancy or dysuria. MUSCULOSKELETAL: No joint or muscle pain, no back pain, no recent trauma. DERMATOLOGIC: No rash, no itching, no lesions. ENDOCRINE: No polyuria, polydipsia, no heat or cold intolerance. No recent change in weight. HEMATOLOGICAL: No anemia or easy bruising or bleeding. NEUROLOGIC: No headache, seizures, numbness, tingling or weakness. PSYCHIATRIC: No depression, no loss of interest in normal activity or change in sleep pattern.     Exam:   BP 134/80   Ht 5' 7.5" (1.715 m)   Wt 162 lb (73.5 kg)   BMI 25.00 kg/m   Body mass index is 25 kg/m.  General appearance : Well developed well nourished female. No acute distress HEENT: Eyes: no retinal hemorrhage or exudates,  Neck supple, trachea midline, no carotid bruits, no thyroidmegaly Lungs: Clear to auscultation, no rhonchi or wheezes, or rib retractions  Heart: Regular rate and rhythm, no murmurs or gallops Breast:Examined in sitting and supine position were symmetrical in appearance, no palpable masses or tenderness,  no skin retraction, no nipple inversion, no nipple discharge, no skin discoloration, no axillary or supraclavicular lymphadenopathy Abdomen: no palpable masses or tenderness, no rebound or guarding Extremities: no edema or skin discoloration or tenderness  Pelvic: Vulva: Normal             Vagina: No  gross lesions or discharge  Cervix: No gross lesions or discharge.  Pap reflex done-  Uterus  AV, normal size, shape and consistency, non-tender and mobile  Adnexa  Without masses or tenderness  Anus: Normal   Assessment/Plan:  84 y.o. female for annual exam   1. Encounter for gynecological examination with abnormal finding Gynecologic exam within normal for menopause.  Pap reflex done.  Breast exam status post right breast cancer within normal today.  Will continue annual screening mammogram at [facility].  Last screening mammogram May 2018 was  normal.  Health labs with family physician.  Had colonoscopy with removal of a benign polyp in 2018.  2. Menopause present Well on no HRT.  No postmenopausal bleeding.  Abstinent.  3. Post-menopausal atrophic vaginitis Not symptomatic.  4. History of breast cancer History of right breast cancer.  Continues to do annual screening mammogram which have been normal.  5. Age-related osteoporosis without current pathological fracture On Prolia x 12/2016.  Vitamin D supplements, calcium rich nutrition and regular weightbearing physical activity recommended.  6. SUI (stress urinary incontinence, female) Referred to Urology  Princess Bruins MD, 2:21 PM 08/06/2017

## 2017-08-06 NOTE — Addendum Note (Signed)
Addended by: Thurnell Garbe A on: 08/06/2017 04:00 PM   Modules accepted: Orders

## 2017-08-10 LAB — PAP IG W/ RFLX HPV ASCU

## 2017-11-29 ENCOUNTER — Encounter: Payer: Self-pay | Admitting: Emergency Medicine

## 2017-11-29 ENCOUNTER — Emergency Department: Payer: Medicare Other

## 2017-11-29 ENCOUNTER — Emergency Department
Admission: EM | Admit: 2017-11-29 | Discharge: 2017-11-29 | Disposition: A | Discharge status: Home / Self Care | Payer: Medicare Other | Attending: Emergency Medicine | Admitting: Emergency Medicine

## 2017-11-29 DIAGNOSIS — K5730 Diverticulosis of large intestine without perforation or abscess without bleeding: Secondary | ICD-10-CM | POA: Diagnosis not present

## 2017-11-29 DIAGNOSIS — Z853 Personal history of malignant neoplasm of breast: Secondary | ICD-10-CM | POA: Diagnosis not present

## 2017-11-29 DIAGNOSIS — K5792 Diverticulitis of intestine, part unspecified, without perforation or abscess without bleeding: Secondary | ICD-10-CM

## 2017-11-29 DIAGNOSIS — Z7901 Long term (current) use of anticoagulants: Secondary | ICD-10-CM | POA: Insufficient documentation

## 2017-11-29 DIAGNOSIS — Z79899 Other long term (current) drug therapy: Secondary | ICD-10-CM | POA: Insufficient documentation

## 2017-11-29 DIAGNOSIS — R1032 Left lower quadrant pain: Secondary | ICD-10-CM | POA: Diagnosis present

## 2017-11-29 HISTORY — DX: Diverticulitis of intestine, part unspecified, without perforation or abscess without bleeding: K57.92

## 2017-11-29 LAB — URINALYSIS, COMPLETE (UACMP) WITH MICROSCOPIC
BACTERIA UA: NONE SEEN
BILIRUBIN URINE: NEGATIVE
Glucose, UA: NEGATIVE mg/dL
Hgb urine dipstick: NEGATIVE
Ketones, ur: NEGATIVE mg/dL
LEUKOCYTES UA: NEGATIVE
Nitrite: NEGATIVE
Protein, ur: NEGATIVE mg/dL
SPECIFIC GRAVITY, URINE: 1.02 (ref 1.005–1.030)
SQUAMOUS EPITHELIAL / LPF: NONE SEEN (ref 0–5)
pH: 5 (ref 5.0–8.0)

## 2017-11-29 LAB — CBC WITH DIFFERENTIAL/PLATELET
BASOS ABS: 0 10*3/uL (ref 0–0.1)
BASOS PCT: 0 %
EOS ABS: 0.2 10*3/uL (ref 0–0.7)
Eosinophils Relative: 1 %
HCT: 45 % (ref 35.0–47.0)
Hemoglobin: 15.4 g/dL (ref 12.0–16.0)
Lymphocytes Relative: 19 %
Lymphs Abs: 2.5 10*3/uL (ref 1.0–3.6)
MCH: 31.5 pg (ref 26.0–34.0)
MCHC: 34.3 g/dL (ref 32.0–36.0)
MCV: 91.8 fL (ref 80.0–100.0)
Monocytes Absolute: 1.1 10*3/uL — ABNORMAL HIGH (ref 0.2–0.9)
Monocytes Relative: 8 %
Neutro Abs: 9.8 10*3/uL — ABNORMAL HIGH (ref 1.4–6.5)
Neutrophils Relative %: 72 %
Platelets: 241 10*3/uL (ref 150–440)
RBC: 4.9 MIL/uL (ref 3.80–5.20)
RDW: 12.8 % (ref 11.5–14.5)
WBC: 13.6 10*3/uL — AB (ref 3.6–11.0)

## 2017-11-29 LAB — COMPREHENSIVE METABOLIC PANEL
ALK PHOS: 43 U/L (ref 38–126)
ALT: 16 U/L (ref 14–54)
AST: 27 U/L (ref 15–41)
Albumin: 4 g/dL (ref 3.5–5.0)
Anion gap: 11 (ref 5–15)
BUN: 12 mg/dL (ref 6–20)
CALCIUM: 9.1 mg/dL (ref 8.9–10.3)
CO2: 24 mmol/L (ref 22–32)
CREATININE: 0.9 mg/dL (ref 0.44–1.00)
Chloride: 100 mmol/L — ABNORMAL LOW (ref 101–111)
GFR calc Af Amer: >60 mL/min (ref >60)
GFR, EST NON AFRICAN AMERICAN: 58 mL/min — AB (ref >60)
Glucose, Bld: 149 mg/dL — ABNORMAL HIGH (ref 65–99)
Potassium: 3.8 mmol/L (ref 3.5–5.1)
Sodium: 135 mmol/L (ref 135–145)
Total Bilirubin: 1.4 mg/dL — ABNORMAL HIGH (ref 0.3–1.2)
Total Protein: 7.1 g/dL (ref 6.5–8.1)

## 2017-11-29 LAB — LIPASE, BLOOD: LIPASE: 26 U/L (ref 11–51)

## 2017-11-29 MED ORDER — IOPAMIDOL (ISOVUE-300) INJECTION 61%
100.0000 mL | Freq: Once | INTRAVENOUS | Status: AC | PRN
  Administered 2017-11-29: 100 mL via INTRAVENOUS

## 2017-11-29 MED ORDER — IOPAMIDOL (ISOVUE-300) INJECTION 61%
30.0000 mL | Freq: Once | INTRAVENOUS | Status: AC | PRN
  Administered 2017-11-29: 30 mL via ORAL

## 2017-11-29 NOTE — ED Provider Notes (Signed)
[facility] Emergency Department Provider Note   ____________________________________________   First MD Initiated Contact with Patient 11/29/17 0848     (approximate)  I have reviewed the triage vital signs and the nursing notes.   HISTORY  Chief Complaint Abdominal Pain    HPI Rebecca Velez is a 84 y.o. female who woke up yesterday morning with left lower quadrant pain.  Feels like her diverticulitis but is much more intense than usual.  She has gone to a liquid diet and started her Cipro which she usually does for this but again this is worse pain than usual.  Is not nauseated having no diarrhea she did have a fever this morning when she woke up.  She was also sweaty this morning when she woke up.   Past Medical History:  Diagnosis Date  . Atrophic vaginitis   . Cancer ([place]) 2007   BREAST CANCER/DUCTAL CARCINOMA  . Detrusor instability   . Diverticulitis   . H/O blood clots 2013   IN LUNGS  . Low bone mass     Patient Active Problem List   Diagnosis Date Noted  . Personal history of PE (pulmonary embolism) 08/05/2016  . History of breast cancer 08/05/2015  . Osteoporosis 08/05/2015  . Adenomatous endometrial hyperplasia 04/06/2012  . Cancer ([place])   . Atrophic vaginitis   . Detrusor instability     Past Surgical History:  Procedure Laterality Date  . BREAST LUMPECTOMY  2007   Ductal Carcinoma  . CHOLECYSTECTOMY  2000  . DILATION AND CURETTAGE OF UTERUS    . REMOVAL OF TUMORS ON NUCKLE    . THYROIDECTOMY, PARTIAL      Prior to Admission medications   Medication Sig Start Date End Date Taking? Authorizing Provider  atenolol (TENORMIN) 50 MG tablet Take 50 mg by mouth daily.    [provider]  Cyanocobalamin (VITAMIN B-12 PO) Take by mouth.    [provider]  spironolactone (ALDACTONE) 25 MG tablet Take 1 tablet by mouth daily.    [provider]  XARELTO 20 MG TABS tablet Take 1 tablet by mouth daily.  07/25/16   [provider]    Allergies Codeine; Nsaids; and Levofloxacin  Family History  Problem Relation Age of Onset  . Hypertension Mother   . Dementia Mother   . Hypertension Father   . Cancer Father        COLON  . Heart disease Father   . Diabetes Brother   . Hypertension Brother   . Heart disease Brother   . Cancer Paternal Grandfather        COLON  . Breast cancer Paternal Aunt        Age 24's  . Cancer Paternal Aunt        COLON    Social History Social History   Tobacco Use  . Smoking status: Never Smoker  . Smokeless tobacco: Never Used  Substance Use Topics  . Alcohol use: Yes    Alcohol/week: 0.0 oz    Comment: Rare  . Drug use: No    Review of Systems  Constitutional: No fever/chills Eyes: No visual changes. ENT: No sore throat. Cardiovascular: Denies chest pain. Respiratory: Denies shortness of breath. Gastrointestinal:  abdominal pain.  No nausea, no vomiting.  No diarrhea.  No constipation. Genitourinary: Negative for dysuria. Musculoskeletal: Negative for back pain. Skin: Negative for rash. Neurological: Negative for headaches, focal weakness   ____________________________________________   PHYSICAL EXAM:  VITAL SIGNS: ED  Triage Vitals  Enc Vitals Group     BP 11/29/17 0841 (!) 142/66     Pulse Rate 11/29/17 0841 65     Resp 11/29/17 0841 20     Temp 11/29/17 0841 97.9 F (36.6 C)     Temp Source 11/29/17 0841 Oral     SpO2 11/29/17 0841 95 %     Weight 11/29/17 0842 159 lb (72.1 kg)     Height 11/29/17 0842 5\' 7"  (1.702 m)     Head Circumference --      Peak Flow --      Pain Score 11/29/17 0842 2     Pain Loc --      Pain Edu? --      Excl. in [place]? --     Constitutional: Alert and oriented. Well appearing and in no acute distress. Eyes: Conjunctivae are normal.  Head: Atraumatic. Nose: No congestion/rhinnorhea. Mouth/Throat: Mucous membranes are moist.  Oropharynx non-erythematous. Neck: No  stridor. Cardiovascular: Normal rate, regular rhythm. Grossly normal heart sounds.  Good peripheral circulation. Respiratory: Normal respiratory effort.  No retractions. Lungs CTAB. Gastrointestinal: Soft tender to palpation percussion in the left lower quadrant.  No distention. No abdominal bruits. No CVA tenderness. Musculoskeletal: No lower extremity tenderness nor edema.  No joint effusions. Neurologic:  Normal speech and language. No gross focal neurologic deficits are appreciated. Skin:  Skin is warm, dry and intact. No rash noted. Psychiatric: Mood and affect are normal. Speech and behavior are normal.  ____________________________________________   LABS (all labs ordered are listed, but only abnormal results are displayed)  Labs Reviewed  COMPREHENSIVE METABOLIC PANEL  LIPASE, BLOOD  CBC WITH DIFFERENTIAL/PLATELET  URINALYSIS, COMPLETE (UACMP) WITH MICROSCOPIC   ____________________________________________  EKG  EKG read and interpreted by me shows normal sinus rhythm rate of 64 normal axis nonspecific ST-T wave changes ____________________________________________  RADIOLOGY  ED MD interpretation: CT shows diverticulitis per radiology  Official radiology report(s): No results found.  ____________________________________________   PROCEDURES  Procedure(s) performed:  Procedures  Critical Care performed:   ____________________________________________   INITIAL IMPRESSION / ASSESSMENT AND PLAN / ED COURSE  Patient wishes to try being at home.  She started the Cipro 1 pill last night she is familiar with the treatment for that she has Flagyl at home as well I will have her start both she will return for worse pain fever vomiting or feeling sicker.  She will follow-up with her GI doctor.         ____________________________________________   FINAL CLINICAL IMPRESSION(S) / ED DIAGNOSES  Final diagnoses:  None     ED Discharge Orders    None        Note:  This document was prepared using Dragon voice recognition software and may include unintentional dictation errors.    Nena Polio, MD 11/29/17 1125

## 2017-11-29 NOTE — ED Triage Notes (Signed)
Pt reports pain to her left side of her abdomen this am. Pt reports she woke up with it yesterday morning. Pt reports hx of diverticulitis and states that she thought this may be what it is so she started taking cipro.

## 2017-11-29 NOTE — Discharge Instructions (Addendum)
Please add the Flagyl to your Cipro that she started last night.  Take them as directed.  Please return for worse pain fever or vomiting.  Please also return if you are feeling sicker.  Please follow-up with your regular doctor and your gastroenterologist in the next week or so.

## 2017-11-29 NOTE — ED Notes (Signed)
Pt ambulatory to POV without difficulty with son. NAD. VSS. Discharge instructions, RX and follow up reviewed. All questions and concerns addressed.

## 2019-01-11 ENCOUNTER — Encounter: Payer: Medicare Other | Admitting: Obstetrics & Gynecology

## 2019-02-07 ENCOUNTER — Other Ambulatory Visit: Payer: Self-pay

## 2019-02-08 ENCOUNTER — Encounter: Payer: Self-pay | Admitting: Obstetrics & Gynecology

## 2019-02-08 ENCOUNTER — Ambulatory Visit (INDEPENDENT_AMBULATORY_CARE_PROVIDER_SITE_OTHER): Payer: Medicare Other | Admitting: Obstetrics & Gynecology

## 2019-02-08 VITALS — BP 120/70 | Ht 67.0 in | Wt 146.0 lb

## 2019-02-08 DIAGNOSIS — Z01419 Encounter for gynecological examination (general) (routine) without abnormal findings: Secondary | ICD-10-CM

## 2019-02-08 DIAGNOSIS — M810 Age-related osteoporosis without current pathological fracture: Secondary | ICD-10-CM

## 2019-02-08 DIAGNOSIS — Z780 Asymptomatic menopausal state: Secondary | ICD-10-CM

## 2019-02-08 NOTE — Patient Instructions (Signed)
1. Well female exam with routine gynecological exam Normal gynecologic exam and menopause.  Pap test in March 2019 was negative, no indication to repeat the Pap test.  Breast exam normal.  Last screening mammogram June 2019 was normal per patient.  Colonoscopy in 2018.  Health labs with family physician.  2. Postmenopause Well on no hormone replacement therapy.  No postmenopausal bleeding.  3. Age-related osteoporosis without current pathological fracture Osteoporosis with last bone density in 2020.  Patient is on Prolia.  Vitamin D supplements, calcium intake of 1200 mg daily and regular weightbearing physical activity is recommended.  Other orders - acebutolol (SECTRAL) 200 MG capsule; Take 200 mg by mouth 2 (two) times daily. - b complex vitamins tablet; Take 1 tablet by mouth daily. - denosumab (PROLIA) 60 MG/ML SOSY injection; Inject 60 mg into the skin every 6 (six) months. - cholecalciferol (VITAMIN D3) 25 MCG (1000 UT) tablet; Take 1,000 Units by mouth daily. - hydrALAZINE (APRESOLINE) 10 MG tablet; Take 10 mg by mouth 3 (three) times daily.  Kency, it was a pleasure seeing you today!

## 2019-02-08 NOTE — Progress Notes (Signed)
Rebecca Velez 03/20/1934 BP:[id number]   History:    85 y.o. G3P1A2L1    RP:  Established patient presenting for annual gyn exam   HPI: Post Menopause, well on no HRT.  No PMB.  No pelvic pain.  Abstinent.  Urine/BMs normal.  Breasts normal.  BMI 22.87.  Walking.  Health Labs with Fam MD.  [name] 2018.  Past medical history,surgical history, family history and social history were all reviewed and documented in the EPIC chart.  Gynecologic History No LMP recorded. Patient is postmenopausal. Contraception: abstinence and post menopausal status Last Pap: 08/2017. Results were: Negative Last mammogram: 11/2017. Results were: Normal per patient Bone Density: 2020 Osteoporosis on Prolia Colonoscopy: 2018  Obstetric History OB History  Gravida Para Term Preterm AB Living  3 1     2 1   SAB TAB Ectopic Multiple Live Births               # Outcome Date GA Lbr Len/2nd Weight Sex Delivery Anes PTL Lv  3 AB           2 AB           1 Para              ROS: A ROS was performed and pertinent positives and negatives are included in the history.  GENERAL: No fevers or chills. HEENT: No change in vision, no earache, sore throat or sinus congestion. NECK: No pain or stiffness. CARDIOVASCULAR: No chest pain or pressure. No palpitations. PULMONARY: No shortness of breath, cough or wheeze. GASTROINTESTINAL: No abdominal pain, nausea, vomiting or diarrhea, melena or bright red blood per rectum. GENITOURINARY: No urinary frequency, urgency, hesitancy or dysuria. MUSCULOSKELETAL: No joint or muscle pain, no back pain, no recent trauma. DERMATOLOGIC: No rash, no itching, no lesions. ENDOCRINE: No polyuria, polydipsia, no heat or cold intolerance. No recent change in weight. HEMATOLOGICAL: No anemia or easy bruising or bleeding. NEUROLOGIC: No headache, seizures, numbness, tingling or weakness. PSYCHIATRIC: No depression, no loss of interest in normal activity or change in sleep pattern.     Exam:   BP  120/70   Ht 5\' 7"  (1.702 m)   Wt 146 lb (66.2 kg)   BMI 22.87 kg/m   Body mass index is 22.87 kg/m.  General appearance : Well developed well nourished female. No acute distress HEENT: Eyes: no retinal hemorrhage or exudates,  Neck supple, trachea midline, no carotid bruits, no thyroidmegaly Lungs: Clear to auscultation, no rhonchi or wheezes, or rib retractions  Heart: Regular rate and rhythm, no murmurs or gallops Breast:Examined in sitting and supine position were symmetrical in appearance, no palpable masses or tenderness,  no skin retraction, no nipple inversion, no nipple discharge, no skin discoloration, no axillary or supraclavicular lymphadenopathy Abdomen: no palpable masses or tenderness, no rebound or guarding Extremities: no edema or skin discoloration or tenderness  Pelvic: Vulva: Normal             Vagina: No gross lesions or discharge  Cervix: No gross lesions or discharge  Uterus  AV, normal size, shape and consistency, non-tender and mobile  Adnexa  Without masses or tenderness  Anus: Normal   Assessment/Plan:  85 y.o. female for annual exam   1. Well female exam with routine gynecological exam Normal gynecologic exam and menopause.  Pap test in March 2019 was negative, no indication to repeat the Pap test.  Breast exam normal.  Last screening mammogram June 2019 was normal per patient.  Colonoscopy in 2018.  Health labs with family physician.  2. Postmenopause Well on no hormone replacement therapy.  No postmenopausal bleeding.  3. Age-related osteoporosis without current pathological fracture Osteoporosis with last bone density in 2020.  Patient is on Prolia.  Vitamin D supplements, calcium intake of 1200 mg daily and regular weightbearing physical activity is recommended.  Other orders - acebutolol (SECTRAL) 200 MG capsule; Take 200 mg by mouth 2 (two) times daily. - b complex vitamins tablet; Take 1 tablet by mouth daily. - denosumab (PROLIA) 60 MG/ML SOSY  injection; Inject 60 mg into the skin every 6 (six) months. - cholecalciferol (VITAMIN D3) 25 MCG (1000 UT) tablet; Take 1,000 Units by mouth daily. - hydrALAZINE (APRESOLINE) 10 MG tablet; Take 10 mg by mouth 3 (three) times daily.  Princess Bruins MD, 3:13 PM 02/08/2019

## 2019-04-12 IMAGING — CT CT ABD-PELV W/ CM
2 of 6 series · 15 of 46 positions shown, 17 images · IV contrast (APPLIED)
Comparison: CT scan dated 07/01/2009

CLINICAL DATA: Left-sided abdominal pain. Elevated white blood
count.

EXAM:
CT ABDOMEN AND PELVIS WITH CONTRAST
TECHNIQUE: Multidetector CT imaging of the abdomen and pelvis was performed
using the standard protocol following bolus administration of
intravenous contrast.
CONTRAST:  100mL 7TMEOU-B11 IOPAMIDOL (7TMEOU-B11) INJECTION 61%

[Series 2: axial st · axial · 0.64mm/px · z∈[-1182,-772]mm · 12 of 92 slices shown, 14 images]
[im 5/92  soft-tissue]
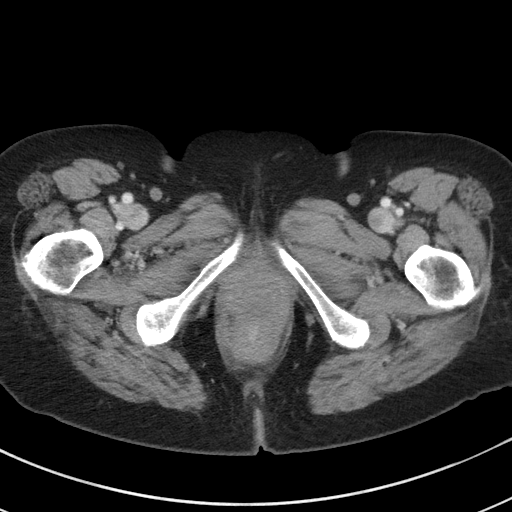
[im 5/92  bone]
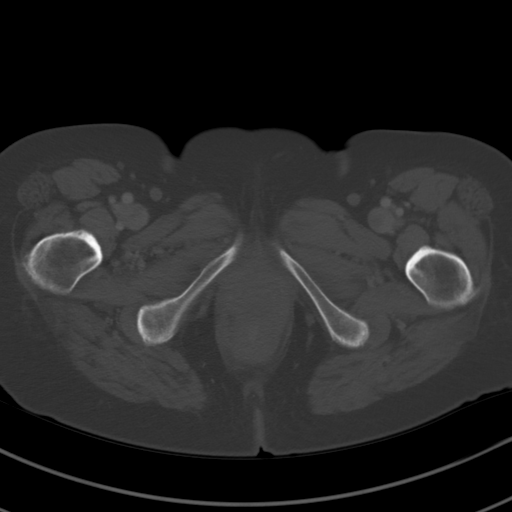
[im 15/92  soft-tissue]
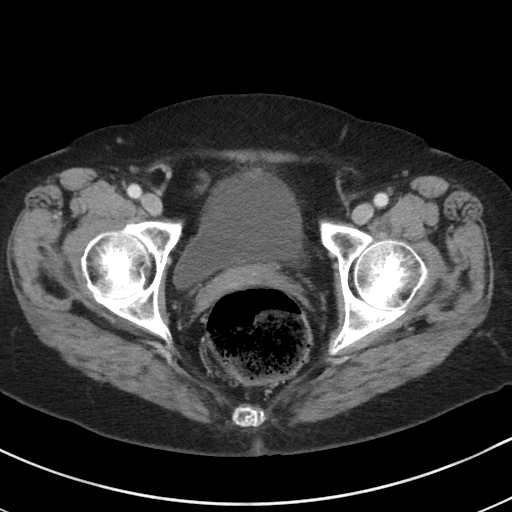
[im 20/92  soft-tissue]
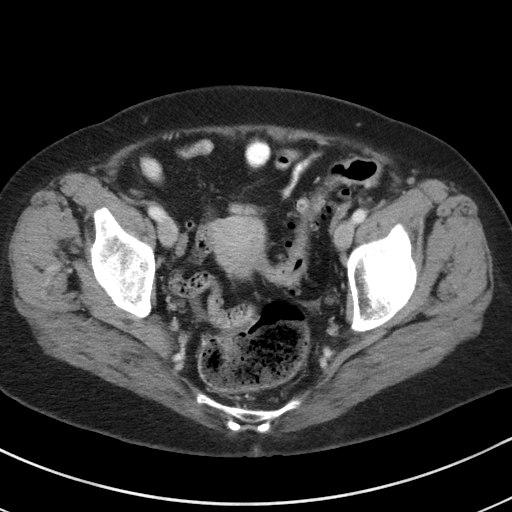
[im 29/92  soft-tissue]
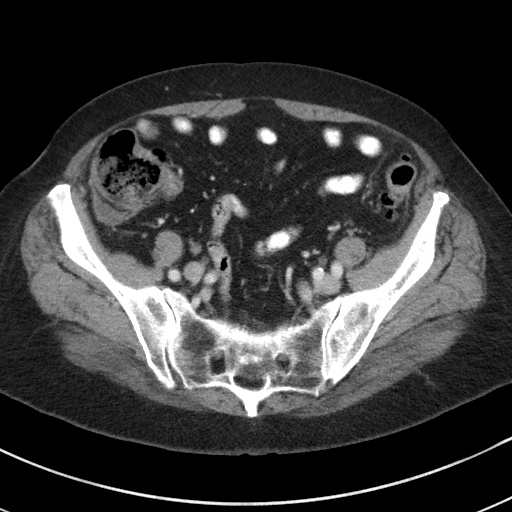
[im 34/92  soft-tissue]
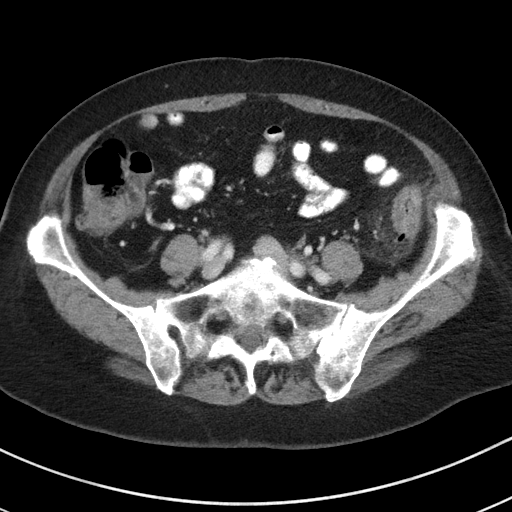
[im 44/92  soft-tissue]
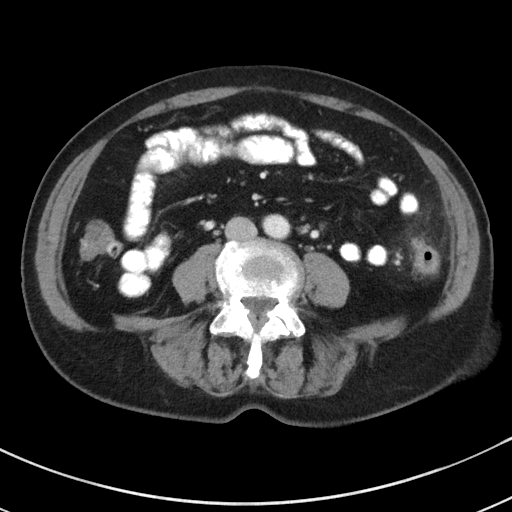
[im 48/92  soft-tissue]
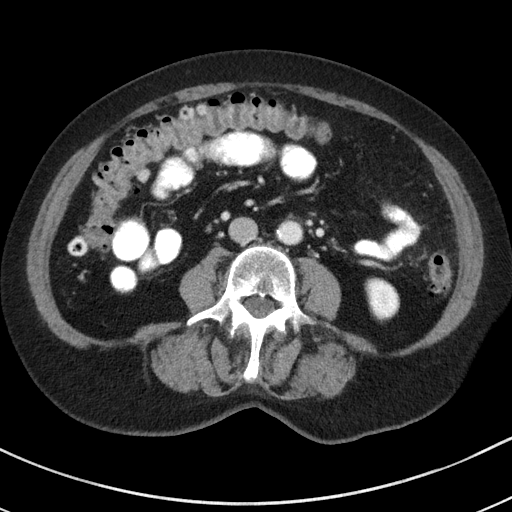
[im 58/92  soft-tissue]
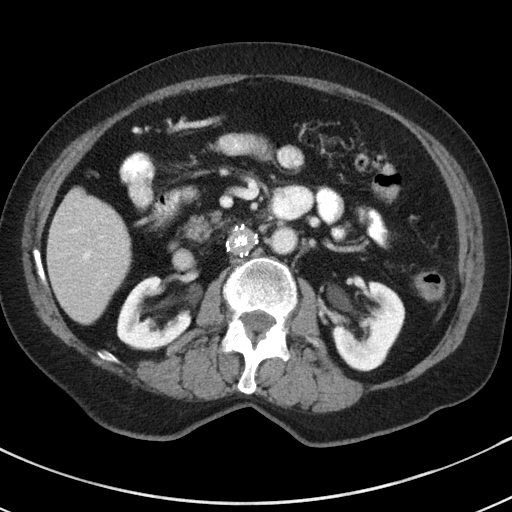
[im 63/92  soft-tissue]
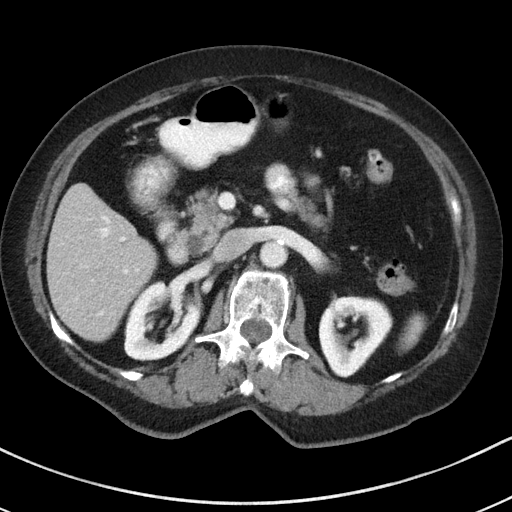
[im 63/92  bone]
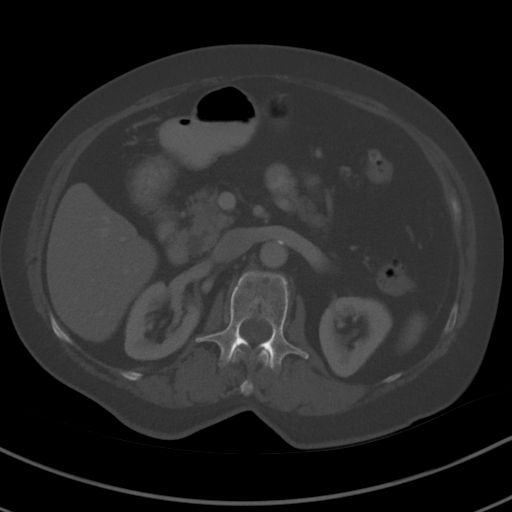
[im 72/92  soft-tissue]
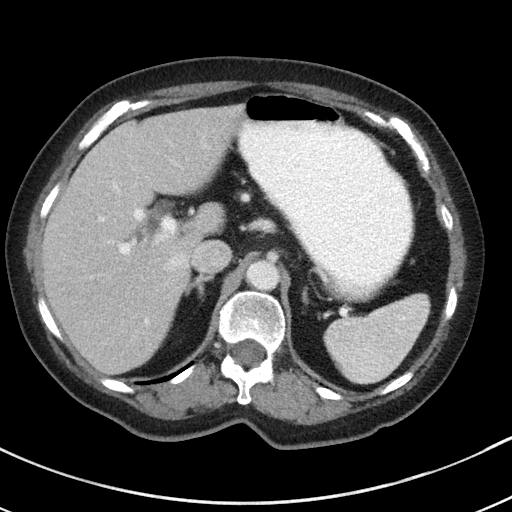
[im 77/92  soft-tissue]
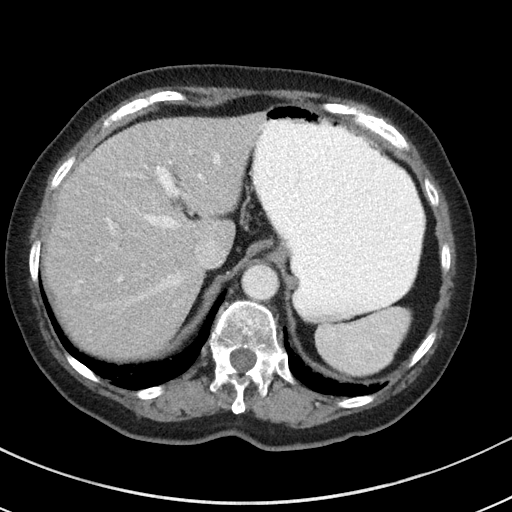
[im 87/92  soft-tissue]
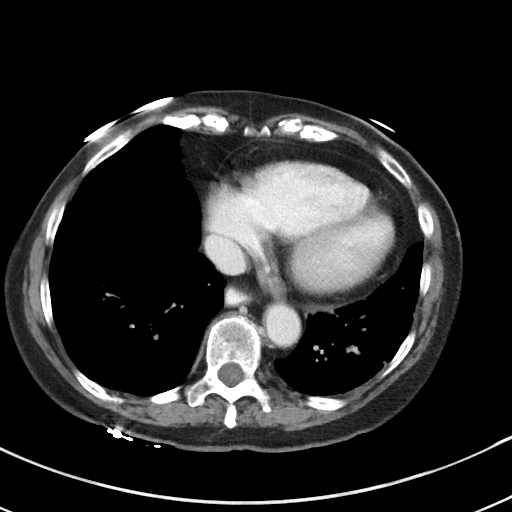

[Series 5: coronal st · coronal · 0.66mm/px · 3 of 90 slices shown]
[im 30/90  soft-tissue]
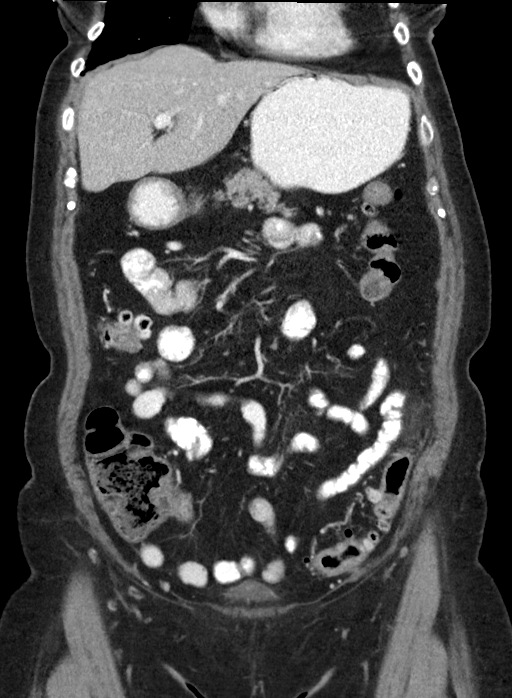
[im 40/90  soft-tissue]
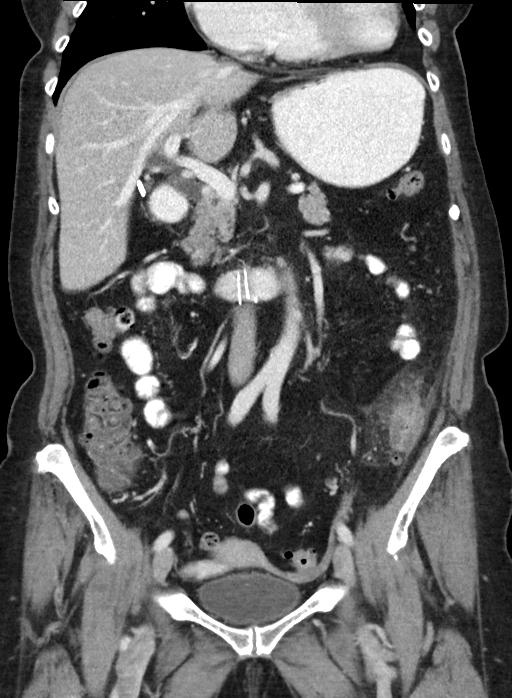
[im 50/90  soft-tissue]
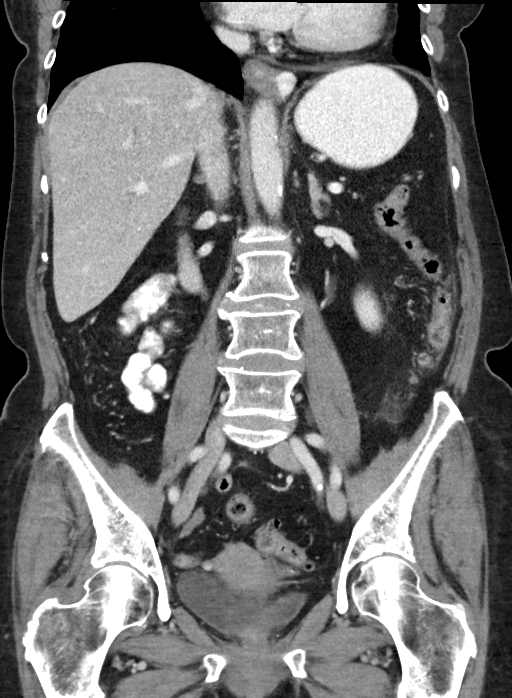

[15 of 46 positions shown; findings below may reference images not displayed]

FINDINGS: Lower chest: New borderline cardiomegaly since 5488. Slight stable
scarring at the lung bases. Small hiatal hernia, chronic.

Hepatobiliary: 12 mm area of increased enhancement in the medial
aspect of the right lobe of the liver on image 24 series 2. This
most likely represents a small hemangioma or a transient hepatic
attenuation difference and appears unchanged since 5488. Liver
parenchyma is otherwise normal. Gallbladder has been removed.
Chronic dilatation of the common bile duct.

Pancreas: No acute abnormality. Slight prominence of the pancreatic
duct in the head of the pancreas, unchanged. Chronic prominence of
the distal common bile duct. Pancreatic parenchyma appears normal.

Spleen: Normal in size without focal abnormality.

Adrenals/Urinary Tract: 2 mm stone in the lower pole of the
otherwise normal right kidney. Left kidney is normal. Adrenal glands
and bladder are normal.

Stomach/Bowel: There is acute diverticulitis of the mid descending
colon with prominent pericolonic soft tissue stranding and focal
mucosal edema. No diverticular abscess or free air or free fluid.
This is at the level of left iliac crest.

The patient has extensive diverticular disease throughout the entire
colon. Prominent stool in the rectum. Small bowel and appendix are
normal.

Vascular/Lymphatic: Slight aortic atherosclerosis. No enlarged
abdominal or pelvic lymph nodes. IVC filter in place just below the
renal veins.

Reproductive: Uterus and bilateral adnexa are unremarkable.

Other: No abdominal wall hernia or abnormality. No abdominopelvic
ascites.

Musculoskeletal: No acute or significant osseous findings.
IMPRESSION: 1. Acute diverticulitis of the descending colon. Pericolonic soft
tissue inflammation without an abscess or free air. Prominent
mucosal edema at the site of diverticulitis.
2. Extensive diverticulosis of the entire colon.
3. 2 mm stone in the lower pole of the right kidney.
4. New borderline cardiomegaly since [DATE]. Chronic prominence of the common bile duct, unchanged.
6. Small chronic hiatal hernia.

## 2020-03-26 ENCOUNTER — Encounter: Payer: Medicare Other | Admitting: Obstetrics & Gynecology

## 2020-05-14 ENCOUNTER — Other Ambulatory Visit: Payer: Self-pay

## 2020-05-14 ENCOUNTER — Ambulatory Visit: Payer: Medicare Other | Admitting: Obstetrics & Gynecology

## 2020-05-14 ENCOUNTER — Encounter: Payer: Self-pay | Admitting: Obstetrics & Gynecology

## 2020-05-14 VITALS — BP 126/82 | Ht 67.0 in | Wt 156.0 lb

## 2020-05-14 DIAGNOSIS — Z01419 Encounter for gynecological examination (general) (routine) without abnormal findings: Secondary | ICD-10-CM | POA: Diagnosis not present

## 2020-05-14 DIAGNOSIS — Z780 Asymptomatic menopausal state: Secondary | ICD-10-CM | POA: Diagnosis not present

## 2020-05-14 DIAGNOSIS — R350 Frequency of micturition: Secondary | ICD-10-CM | POA: Diagnosis not present

## 2020-05-14 DIAGNOSIS — M810 Age-related osteoporosis without current pathological fracture: Secondary | ICD-10-CM | POA: Diagnosis not present

## 2020-05-14 DIAGNOSIS — Z853 Personal history of malignant neoplasm of breast: Secondary | ICD-10-CM

## 2020-05-14 NOTE — Progress Notes (Signed)
Rebecca Velez 1933/07/04 [id number]   History:    86 y.o. G3P1A2L1    RP:  Established patient presenting for annual gyn exam   HPI: Post Menopause, well on no HRT.  No PMB.  No pelvic pain.  Abstinent.  Urine/BMs normal.  Treated an acute Cystitis 1 month ago. Breasts normal.  BMI 24.43.  Walking.  Health Labs with Fam MD.  Rebecca Velez 2018.  BD Osteoporosis stable on Prolia.   Past medical history,surgical history, family history and social history were all reviewed and documented in the EPIC chart.  Gynecologic History No LMP recorded. Patient is postmenopausal.  Obstetric History OB History  Gravida Para Term Preterm AB Living  3 1     2 1   SAB TAB Ectopic Multiple Live Births               # Outcome Date GA Lbr Len/2nd Weight Sex Delivery Anes PTL Lv  3 AB           2 AB           1 Para              ROS: A ROS was performed and pertinent positives and negatives are included in the history.  GENERAL: No fevers or chills. HEENT: No change in vision, no earache, sore throat or sinus congestion. NECK: No pain or stiffness. CARDIOVASCULAR: No chest pain or pressure. No palpitations. PULMONARY: No shortness of breath, cough or wheeze. GASTROINTESTINAL: No abdominal pain, nausea, vomiting or diarrhea, melena or bright red blood per rectum. GENITOURINARY: No urinary frequency, urgency, hesitancy or dysuria. MUSCULOSKELETAL: No joint or muscle pain, no back pain, no recent trauma. DERMATOLOGIC: No rash, no itching, no lesions. ENDOCRINE: No polyuria, polydipsia, no heat or cold intolerance. No recent change in weight. HEMATOLOGICAL: No anemia or easy bruising or bleeding. NEUROLOGIC: No headache, seizures, numbness, tingling or weakness. PSYCHIATRIC: No depression, no loss of interest in normal activity or change in sleep pattern.     Exam:   BP 126/82   Ht 5\' 7"  (1.702 m)   Wt 156 lb (70.8 kg)   BMI 24.43 kg/m   Body mass index is 24.43 kg/m.  General appearance : Well  developed well nourished female. No acute distress HEENT: Eyes: no retinal hemorrhage or exudates,  Neck supple, trachea midline, no carotid bruits, no thyroidmegaly Lungs: Clear to auscultation, no rhonchi or wheezes, or rib retractions  Heart: Regular rate and rhythm, no murmurs or gallops Breast:Examined in sitting and supine position were symmetrical in appearance, no palpable masses or tenderness,  no skin retraction, no nipple inversion, no nipple discharge, no skin discoloration, no axillary or supraclavicular lymphadenopathy Abdomen: no palpable masses or tenderness, no rebound or guarding Extremities: no edema or skin discoloration or tenderness  Pelvic: Vulva: Normal             Vagina: No gross lesions or discharge  Cervix: No gross lesions or discharge  Uterus  AV, normal size, shape and consistency, non-tender and mobile  Adnexa  Without masses or tenderness  Anus: Normal  U/A Negative   Assessment/Plan:  86 y.o. female for annual exam   1. Well female exam with routine gynecological exam Normal gynecologic exam in postmenopausal.  No indication for Pap test at this time.  Breast exam normal.  Screening mammogram June 2021 was negative.  Colonoscopy in 2018.  Health labs with family physician.  Good body mass index at 24.43.  Continue with  fitness and healthy nutrition.  2. Postmenopause Well on no HRT.  No PMB.  3. Age-related osteoporosis without current pathological fracture Stable on Prolia.  Vitamin D supplements, Ca++ 1500 mg daily, continue with daily walks.  4. Frequency of urination U/A Negative. - Urinalysis,Complete w/RFL Culture  5. History of breast cancer  Other orders - Probiotic Product (ALIGN) 4 MG CAPS; Take by mouth. - fluticasone (FLONASE) 50 MCG/ACT nasal spray; Place into both nostrils daily. - Ascorbic Acid (VITAMIN C) 1000 MG tablet; Take 1,000 mg by mouth daily.  Princess Bruins MD, 3:05 PM 05/14/2020

## 2020-05-15 ENCOUNTER — Encounter: Payer: Self-pay | Admitting: Obstetrics & Gynecology

## 2020-05-15 LAB — URINALYSIS, COMPLETE W/RFL CULTURE
Bilirubin Urine: NEGATIVE
Nitrites, Initial: NEGATIVE
Protein, ur: NEGATIVE
RBC / HPF: NONE SEEN /HPF (ref 0–2)

## 2020-05-16 LAB — URINE CULTURE
MICRO NUMBER:: 11288692
SPECIMEN QUALITY:: ADEQUATE

## 2020-05-16 LAB — URINALYSIS, COMPLETE W/RFL CULTURE
Bacteria, UA: NONE SEEN /HPF
Glucose, UA: NEGATIVE
Hgb urine dipstick: NEGATIVE
Hyaline Cast: NONE SEEN /LPF
Ketones, ur: NEGATIVE
Specific Gravity, Urine: 1.005 (ref 1.001–1.03)
Squamous Epithelial / LPF: NONE SEEN /HPF (ref <=5)
WBC, UA: NONE SEEN /HPF (ref 0–5)
pH: 6 (ref 5.0–8.0)

## 2020-05-16 LAB — CULTURE INDICATED

## 2021-06-25 ENCOUNTER — Ambulatory Visit: Payer: Medicare Other | Admitting: Obstetrics & Gynecology

## 2021-08-26 ENCOUNTER — Ambulatory Visit: Payer: Medicare Other | Admitting: Obstetrics & Gynecology

## 2021-09-25 ENCOUNTER — Ambulatory Visit: Payer: Medicare Other | Admitting: Obstetrics & Gynecology

## 2021-10-13 ENCOUNTER — Ambulatory Visit: Payer: Medicare Other | Admitting: Obstetrics & Gynecology

## 2021-12-03 ENCOUNTER — Ambulatory Visit: Payer: Medicare Other | Admitting: Obstetrics & Gynecology

## 2022-01-08 ENCOUNTER — Ambulatory Visit (INDEPENDENT_AMBULATORY_CARE_PROVIDER_SITE_OTHER): Payer: Medicare Other | Admitting: Obstetrics & Gynecology

## 2022-01-08 ENCOUNTER — Encounter: Payer: Self-pay | Admitting: Obstetrics & Gynecology

## 2022-01-08 VITALS — BP 124/80 | HR 55 | Ht 66.75 in | Wt 150.0 lb

## 2022-01-08 DIAGNOSIS — Z01419 Encounter for gynecological examination (general) (routine) without abnormal findings: Secondary | ICD-10-CM | POA: Diagnosis not present

## 2022-01-08 DIAGNOSIS — M810 Age-related osteoporosis without current pathological fracture: Secondary | ICD-10-CM

## 2022-01-08 DIAGNOSIS — Z853 Personal history of malignant neoplasm of breast: Secondary | ICD-10-CM

## 2022-01-08 DIAGNOSIS — Z780 Asymptomatic menopausal state: Secondary | ICD-10-CM

## 2022-01-08 NOTE — Progress Notes (Signed)
Rebecca Velez February 09, 1934 [id number]   History:    88 y.o. [place] [facility].   RP:  Established patient presenting for annual gyn exam    HPI: Post Menopause, well on no HRT.  No PMB.  No pelvic pain.  Abstinent.  Pap Neg 08/2017.  No h/o abnormal Pap.  No indication to repeat a Pap at this time.  Urine/BMs normal.  Occasional diverticulitis. H/O Breast Ca.  Breasts normal. Screening mammo Neg 02/2021 in [place]. BMI 23.67.  Walking, but limited by Rt Knee.  BD improved in 2023 per patient.  No longer on Prolia. Health Labs with Fam MD.  [name] 2018.     Past medical history,surgical history, family history and social history were all reviewed and documented in the EPIC chart.  Gynecologic History No LMP recorded. Patient is postmenopausal.  Obstetric History OB History  Gravida Para Term Preterm AB Living  '3 1 1   2 1  '$ SAB IAB Ectopic Multiple Live Births  2            # Outcome Date GA Lbr Len/2nd Weight Sex Delivery Anes PTL Lv  3 SAB           2 SAB           1 Term              ROS: A ROS was performed and pertinent positives and negatives are included in the history. GENERAL: No fevers or chills. HEENT: No change in vision, no earache, sore throat or sinus congestion. NECK: No pain or stiffness. CARDIOVASCULAR: No chest pain or pressure. No palpitations. PULMONARY: No shortness of breath, cough or wheeze. GASTROINTESTINAL: No abdominal pain, nausea, vomiting or diarrhea, melena or bright red blood per rectum. GENITOURINARY: No urinary frequency, urgency, hesitancy or dysuria. MUSCULOSKELETAL: No joint or muscle pain, no back pain, no recent trauma. DERMATOLOGIC: No rash, no itching, no lesions. ENDOCRINE: No polyuria, polydipsia, no heat or cold intolerance. No recent change in weight. HEMATOLOGICAL: No anemia or easy bruising or bleeding. NEUROLOGIC: No headache, seizures, numbness, tingling or weakness. PSYCHIATRIC: No depression, no loss of interest in  normal activity or change in sleep pattern.     Exam:   BP 124/80   Pulse (!) 55   Ht 5' 6.75" (1.695 m)   Wt 150 lb (68 kg)   SpO2 97%   BMI 23.67 kg/m   Body mass index is 23.67 kg/m.  General appearance : Well developed well nourished female. No acute distress HEENT: Eyes: no retinal hemorrhage or exudates,  Neck supple, trachea midline, no carotid bruits, no thyroidmegaly Lungs: Clear to auscultation, no rhonchi or wheezes, or rib retractions  Heart: Regular rate and rhythm, no murmurs or gallops Breast:Examined in sitting and supine position were symmetrical in appearance, no palpable masses or tenderness,  no skin retraction, no nipple inversion, no nipple discharge, no skin discoloration, no axillary or supraclavicular lymphadenopathy Abdomen: no palpable masses or tenderness, no rebound or guarding Extremities: no edema or skin discoloration or tenderness  Pelvic: Vulva: Normal             Vagina: No gross lesions or discharge  Cervix: No gross lesions or discharge  Uterus  AV, normal size, shape and consistency, non-tender and mobile  Adnexa  Without masses or tenderness  Anus: Normal   Assessment/Plan:  88 y.o. female for annual exam   1. Well female exam with routine gynecological exam Post  Menopause, well on no HRT.  No PMB.  No pelvic pain.  Abstinent.  Pap Neg 08/2017.  No h/o abnormal Pap.  No indication to repeat a Pap at this time.  Urine/BMs normal.  Occasional diverticulitis. H/O Breast Ca.  Breasts normal. Screening mammo Neg 02/2021 in [place]. BMI 23.67.  Walking, but limited by Rt Knee.  BD improved in 2023 per patient.  No longer on Prolia. Health Labs with Fam MD.  [name] 2018.    2. Postmenopause Post Menopause, well on no HRT.  No PMB.  No pelvic pain.  Abstinent.  3. Age-related osteoporosis without current pathological fracture Walking, but limited by Rt Knee.  BD improved in 2023 per patient.  No longer on Prolia.  4. History of breast  cancer  Other orders - ALPRAZolam (XANAX) 0.25 MG tablet; Take 0.25 mg by mouth daily as needed.   Princess Bruins MD, 11:49 AM 01/08/2022

## 2024-01-20 ENCOUNTER — Encounter: Admitting: Obstetrics and Gynecology

## 2024-02-14 ENCOUNTER — Encounter: Admitting: Obstetrics and Gynecology

## 2024-03-16 ENCOUNTER — Encounter: Admitting: Obstetrics and Gynecology

## 2024-05-29 ENCOUNTER — Encounter: Admitting: Obstetrics and Gynecology

## 2024-06-21 ENCOUNTER — Encounter: Admitting: Obstetrics and Gynecology

## 2024-08-08 ENCOUNTER — Encounter: Admitting: Obstetrics and Gynecology
